# Patient Record
Sex: Female | Born: 1995 | Race: Black or African American | Hispanic: No | Marital: Single | Smoking: Never smoker
Health system: Southern US, Community
[De-identification: ages and names within clinical notes are randomized; demographics above are authoritative.]

## PROBLEM LIST (undated history)

## (undated) DIAGNOSIS — L309 Dermatitis, unspecified: Secondary | ICD-10-CM

## (undated) HISTORY — DX: Dermatitis, unspecified: L30.9

## (undated) HISTORY — PX: TONSILLECTOMY: SUR1361

## (undated) HISTORY — PX: TYMPANOSTOMY TUBE PLACEMENT: SHX32

---

## 2018-01-03 ENCOUNTER — Other Ambulatory Visit: Payer: Self-pay | Admitting: Family Medicine

## 2018-01-03 DIAGNOSIS — N6310 Unspecified lump in the right breast, unspecified quadrant: Secondary | ICD-10-CM

## 2018-01-12 ENCOUNTER — Other Ambulatory Visit: Payer: Self-pay | Admitting: Family Medicine

## 2018-01-12 ENCOUNTER — Ambulatory Visit
Admission: RE | Admit: 2018-01-12 | Discharge: 2018-01-12 | Disposition: A | Discharge status: Home / Self Care | Payer: BLUE CROSS/BLUE SHIELD | Source: Ambulatory Visit | Attending: Family Medicine | Admitting: Family Medicine

## 2018-01-12 DIAGNOSIS — N6310 Unspecified lump in the right breast, unspecified quadrant: Secondary | ICD-10-CM

## 2018-07-16 ENCOUNTER — Other Ambulatory Visit: Payer: BLUE CROSS/BLUE SHIELD

## 2018-07-16 ENCOUNTER — Other Ambulatory Visit: Payer: Self-pay | Admitting: Family Medicine

## 2018-07-16 DIAGNOSIS — N6310 Unspecified lump in the right breast, unspecified quadrant: Secondary | ICD-10-CM

## 2018-07-26 ENCOUNTER — Ambulatory Visit
Admission: RE | Admit: 2018-07-26 | Discharge: 2018-07-26 | Disposition: A | Discharge status: Home / Self Care | Payer: BLUE CROSS/BLUE SHIELD | Source: Ambulatory Visit | Attending: Family Medicine | Admitting: Family Medicine

## 2018-07-26 ENCOUNTER — Other Ambulatory Visit: Payer: Self-pay | Admitting: Family Medicine

## 2018-07-26 DIAGNOSIS — N6310 Unspecified lump in the right breast, unspecified quadrant: Secondary | ICD-10-CM

## 2018-07-31 ENCOUNTER — Other Ambulatory Visit: Payer: Self-pay | Admitting: Family Medicine

## 2019-01-29 ENCOUNTER — Other Ambulatory Visit: Payer: BLUE CROSS/BLUE SHIELD

## 2019-02-07 ENCOUNTER — Inpatient Hospital Stay: Admission: RE | Admit: 2019-02-07 | Payer: BLUE CROSS/BLUE SHIELD | Source: Ambulatory Visit

## 2019-02-18 ENCOUNTER — Ambulatory Visit
Admission: RE | Admit: 2019-02-18 | Discharge: 2019-02-18 | Disposition: A | Discharge status: Home / Self Care | Payer: BC Managed Care – PPO | Source: Ambulatory Visit | Attending: Family Medicine | Admitting: Family Medicine

## 2019-02-18 ENCOUNTER — Other Ambulatory Visit: Payer: Self-pay

## 2019-02-18 DIAGNOSIS — N6310 Unspecified lump in the right breast, unspecified quadrant: Secondary | ICD-10-CM

## 2019-09-06 IMAGING — US ULTRASOUND RIGHT BREAST LIMITED
1 series · 5 of 5 positions shown · non-contrast
Comparison: None

CLINICAL DATA: Palpable lump in the right breast

EXAM:
ULTRASOUND OF THE RIGHT BREAST

[Series 1: ultrasound right breast limited · 0.06mm/px · 5 of 5 slices shown]
[im 1/5]
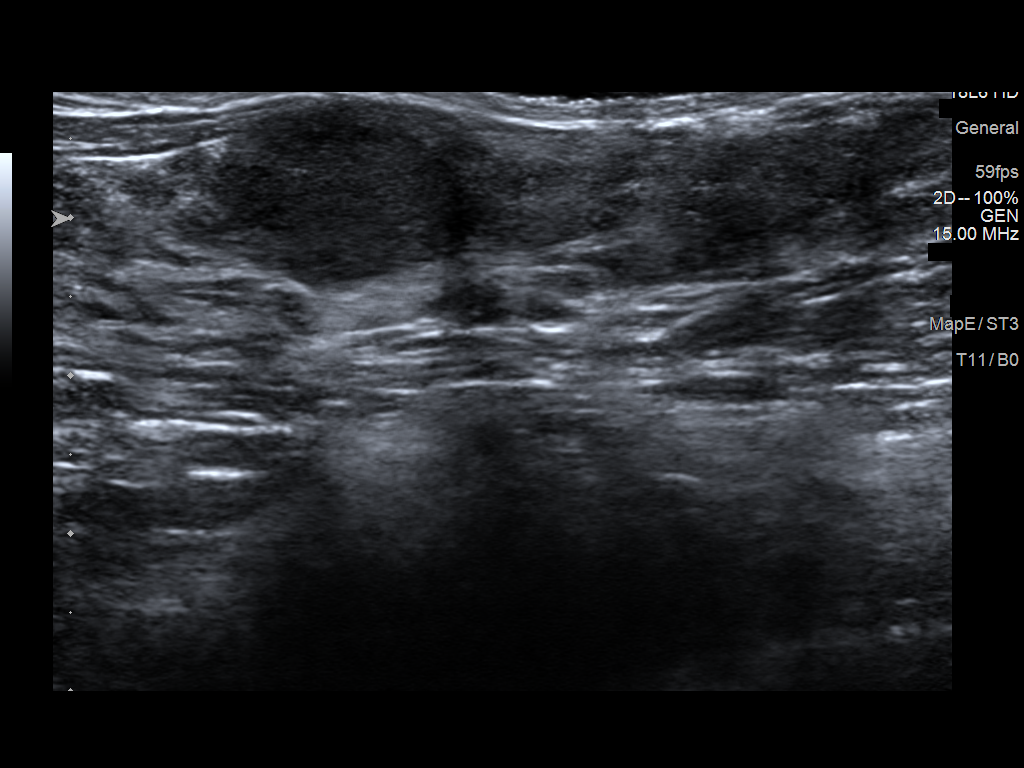
[im 2/5]
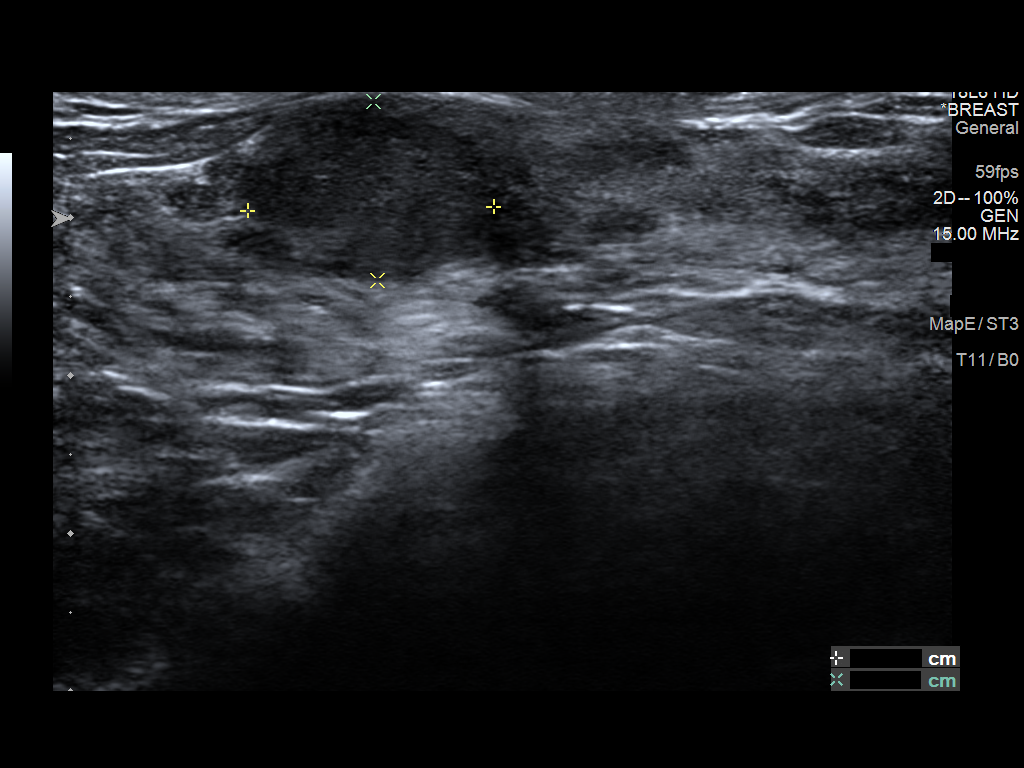
[im 3/5]
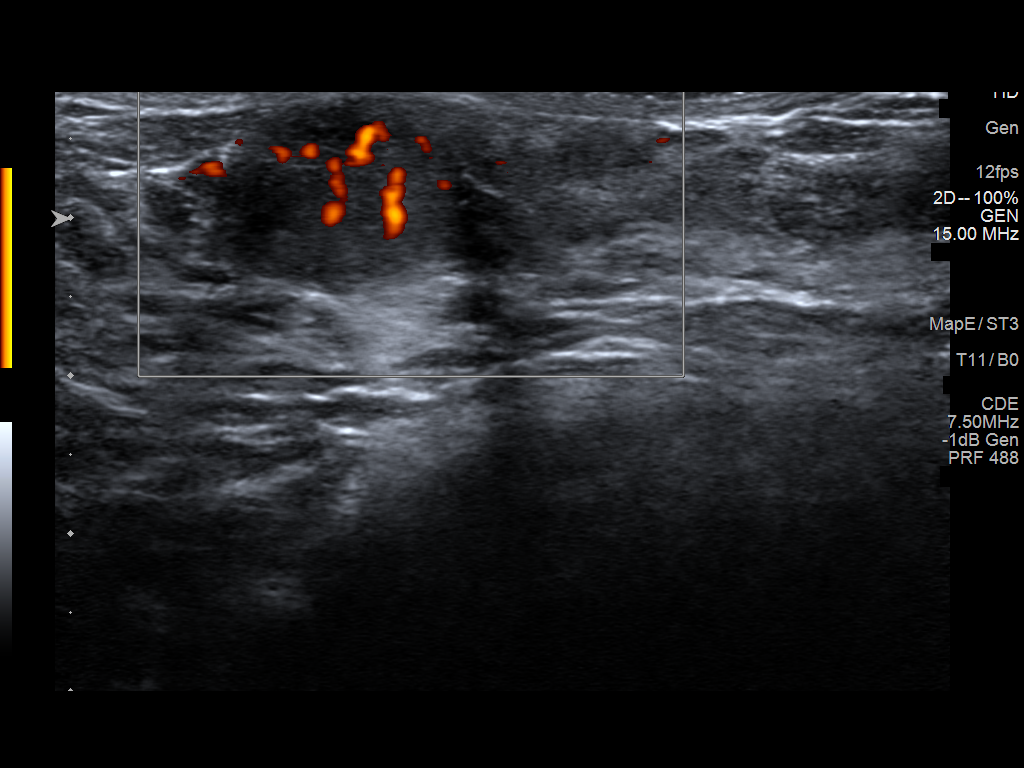
[im 4/5]
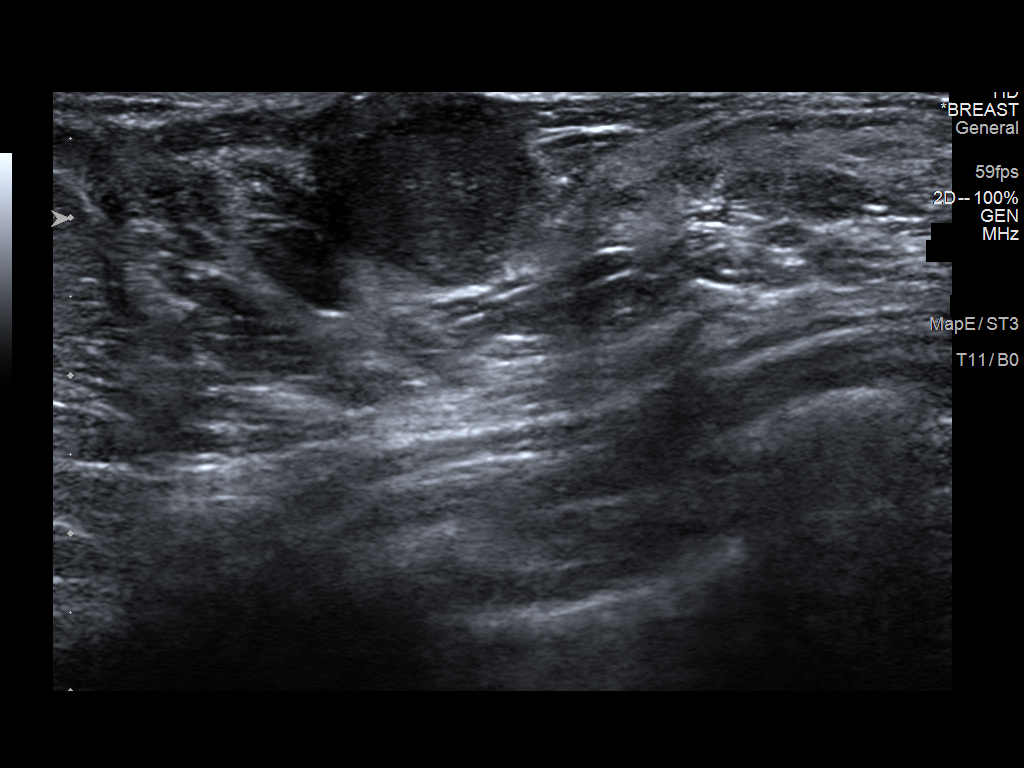
[im 5/5]
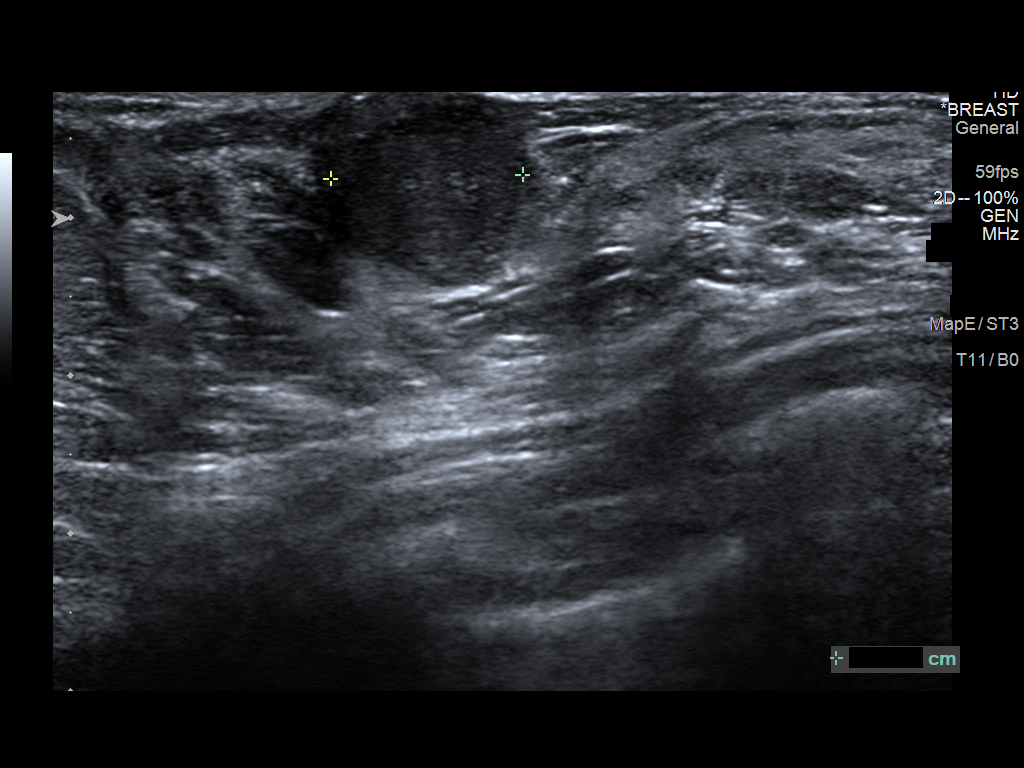

[5 of 5 positions shown; findings below may reference images not displayed]

FINDINGS: On physical exam, a palpable lump is felt in the right breast
inferiorly

Targeted ultrasound is performed, showing a hypoechoic circumscribed
oval mass in the right breast at [DATE], 2 cm from the nipple
measuring 1.6 x 1.1 x 1.2 cm.
IMPRESSION: Probably benign right breast mass

RECOMMENDATION:
Six-month follow-up ultrasound of the probably benign right breast
mass

I have discussed the findings and recommendations with the patient.
Results were also provided in writing at the conclusion of the
visit. If applicable, a reminder letter will be sent to the patient
regarding the next appointment.

BI-RADS CATEGORY  3: Probably benign.

## 2020-01-24 ENCOUNTER — Other Ambulatory Visit: Payer: Self-pay

## 2020-01-24 ENCOUNTER — Other Ambulatory Visit: Payer: Self-pay | Admitting: Sleep Medicine

## 2020-01-24 DIAGNOSIS — I471 Supraventricular tachycardia: Secondary | ICD-10-CM

## 2020-01-25 LAB — NOVEL CORONAVIRUS, NAA: SARS-CoV-2, NAA: NOT DETECTED

## 2020-03-19 IMAGING — US ULTRASOUND RIGHT BREAST LIMITED
1 series · 6 of 6 positions shown · non-contrast
Comparison: 01/12/2018 ultrasound

CLINICAL DATA: 22-year-old female for six-month follow-up of RIGHT
breast mass.

EXAM:
ULTRASOUND OF THE RIGHT BREAST

[Series 1: ultrasound right breast limited · 0.06mm/px · 6 of 6 slices shown]
[im 1/6]
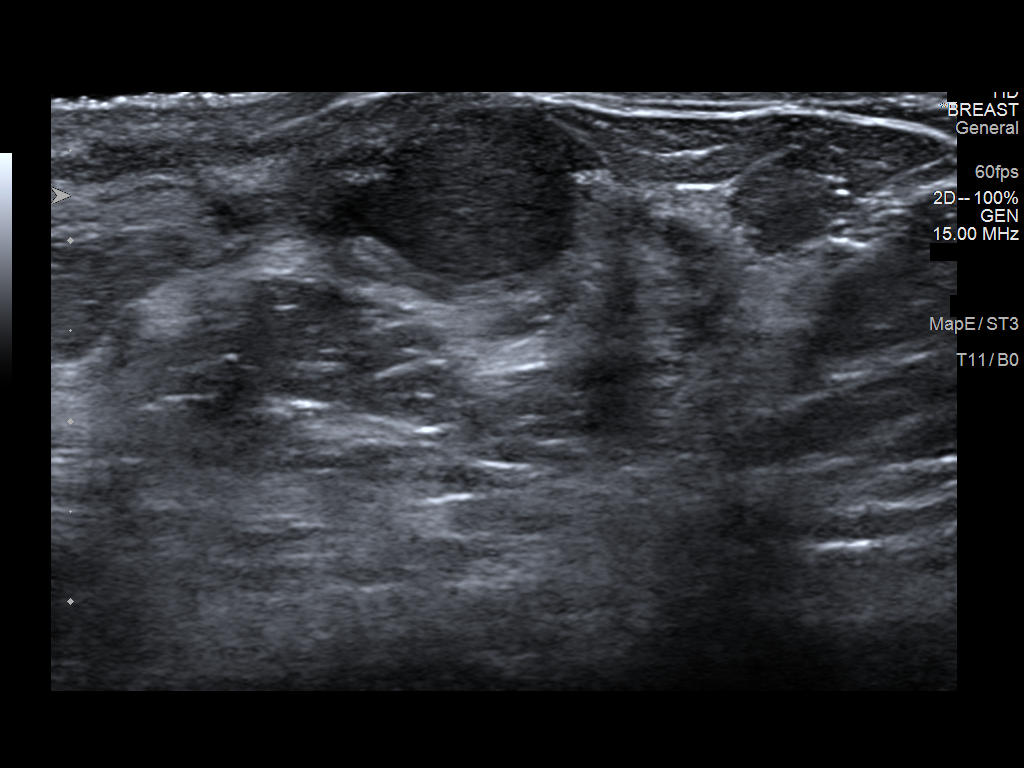
[im 2/6]
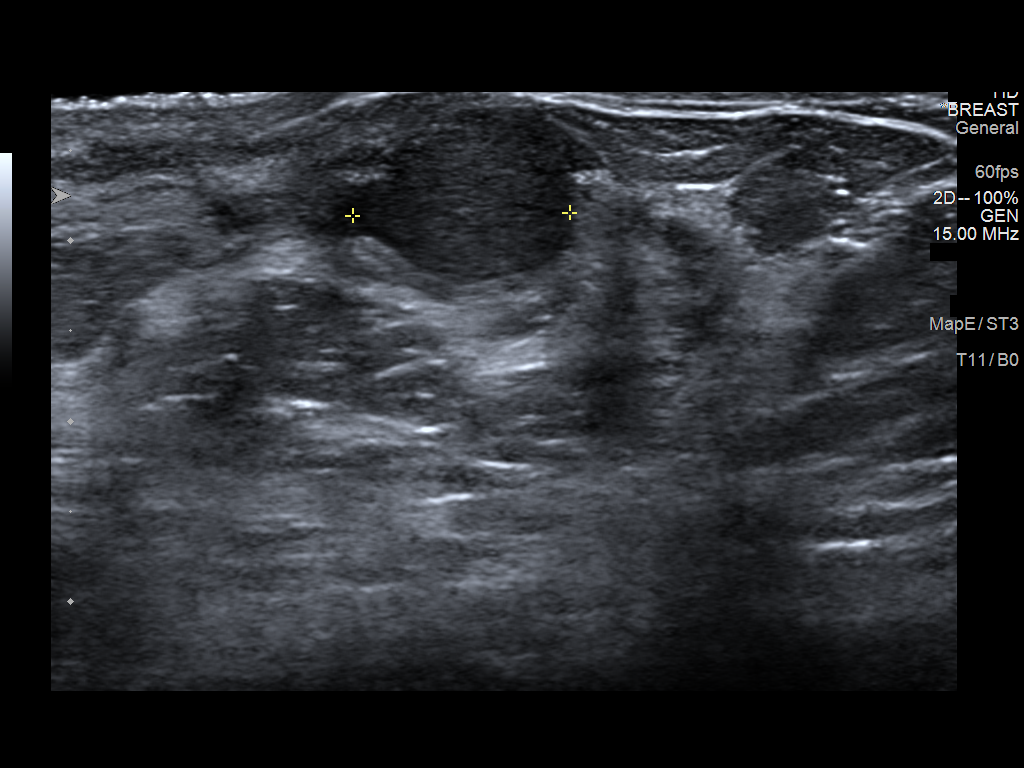
[im 3/6]
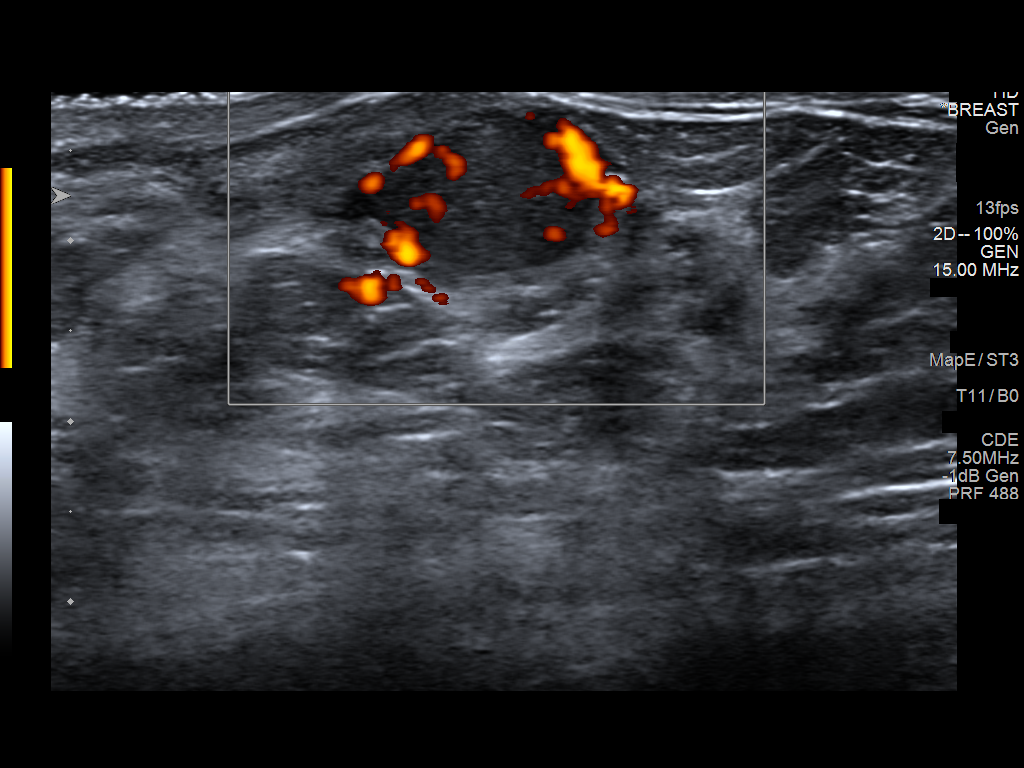
[im 4/6]
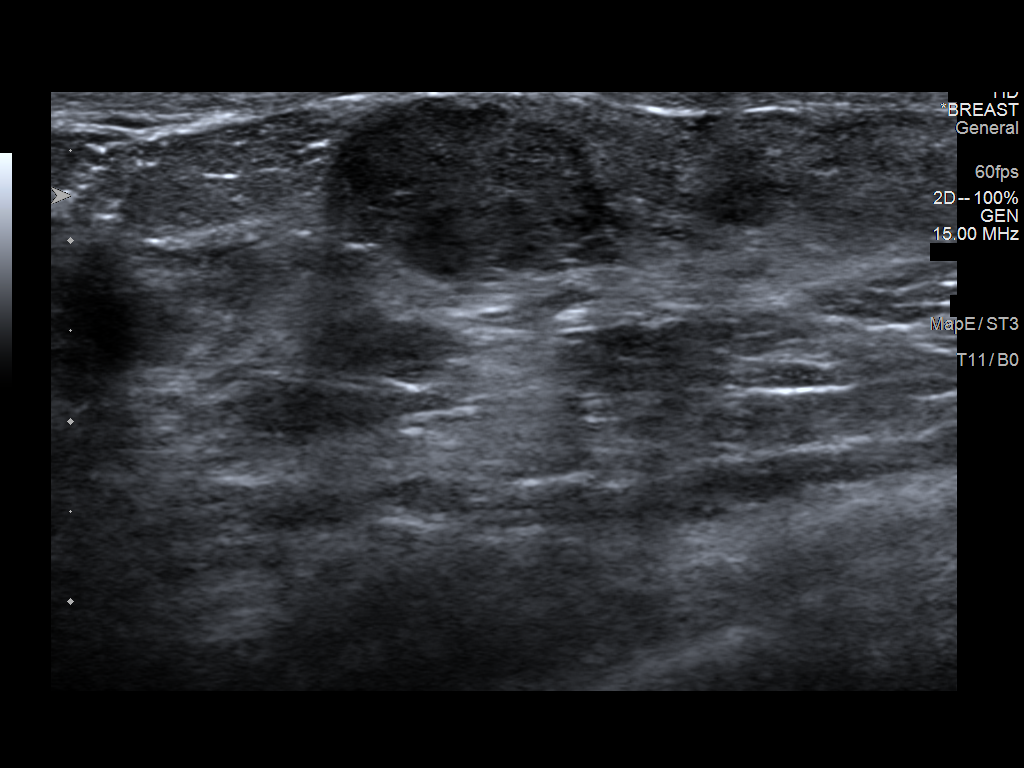
[im 5/6]
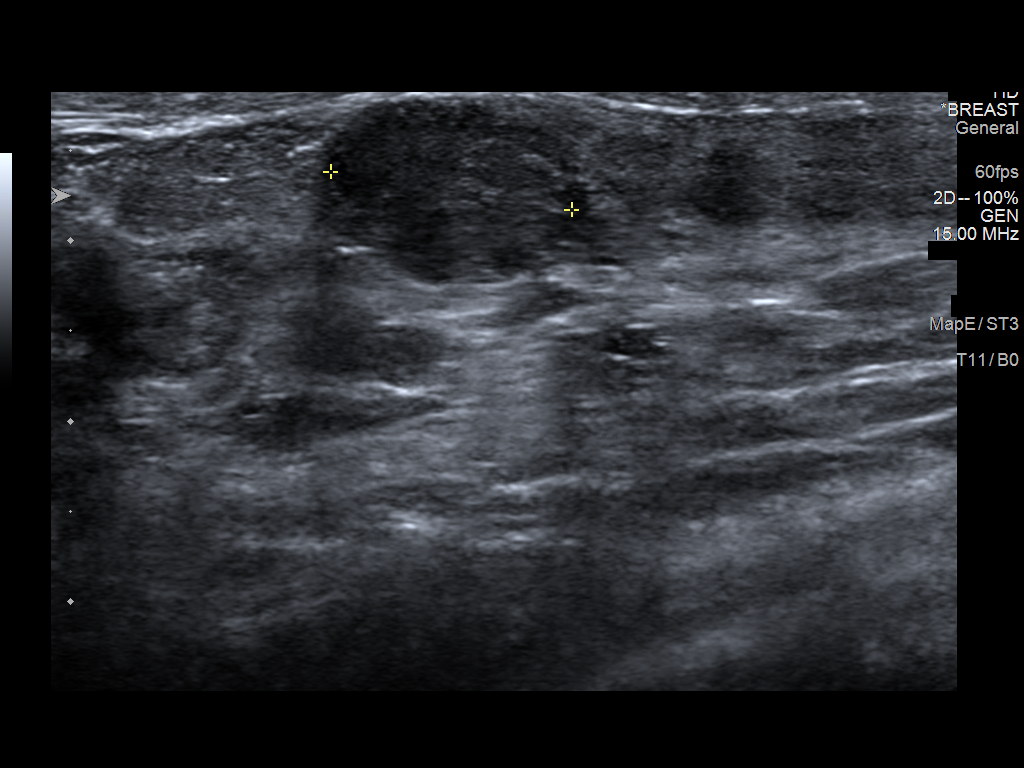
[im 6/6]
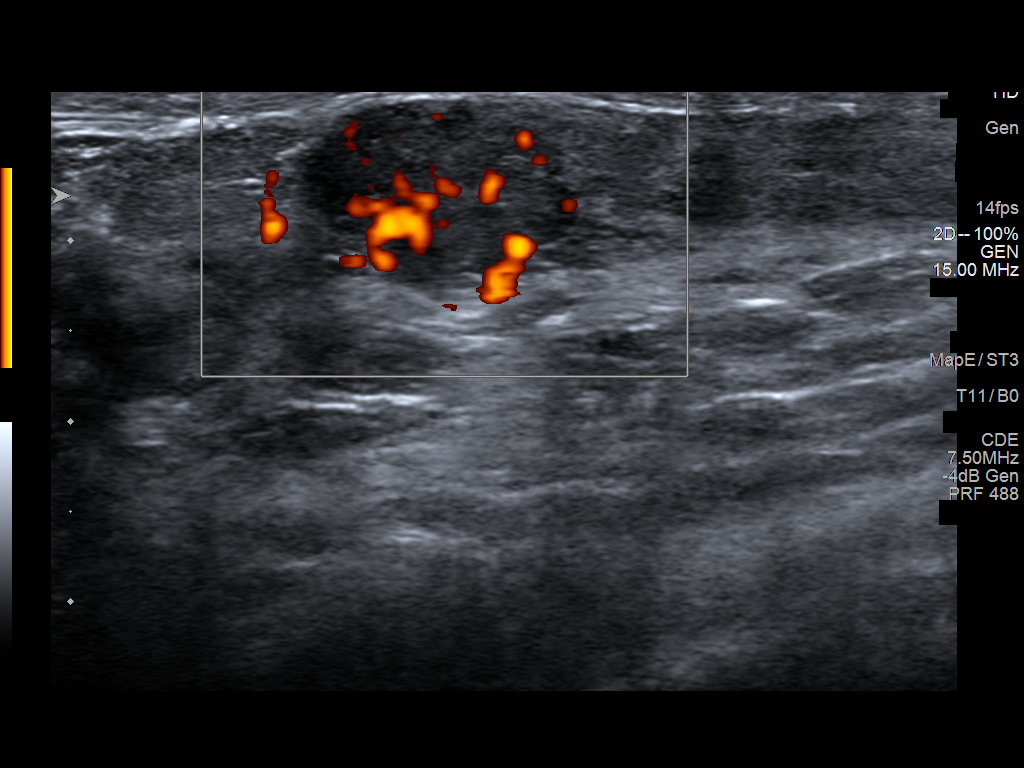

[6 of 6 positions shown; findings below may reference images not displayed]

FINDINGS: Targeted ultrasound is performed, showing a stable 1.4 x 1.1 x
cm circumscribed oval hypoechoic parallel mass at the [DATE] position
of the RIGHT breast 2 cm from the nipple, likely a fibroadenoma.
IMPRESSION: Stable probable fibroadenoma within the LOWER INNER RIGHT breast.
Six-month follow-up recommended to ensure 1 year stability.

RECOMMENDATION:
RIGHT breast ultrasound in 6 month

I have discussed the findings and recommendations with the patient.
Results were also provided in writing at the conclusion of the
visit. If applicable, a reminder letter will be sent to the patient
regarding the next appointment.

BI-RADS CATEGORY  3: Probably benign.

## 2020-10-12 IMAGING — US US BREAST*R* LIMITED INC AXILLA
1 series · 4 of 4 positions shown · non-contrast
Comparison: 07/26/2018, 01/12/2018.

CLINICAL DATA: One year interval follow-up of a likely benign mass
involving the LOWER INNER QUADRANT of the RIGHT breast, possibly a
fibroadenoma.

EXAM:
ULTRASOUND OF THE RIGHT BREAST

[Series 1: us breast*right* limited inc axilla · 0.06mm/px · 4 of 4 slices shown]
[im 1/4]
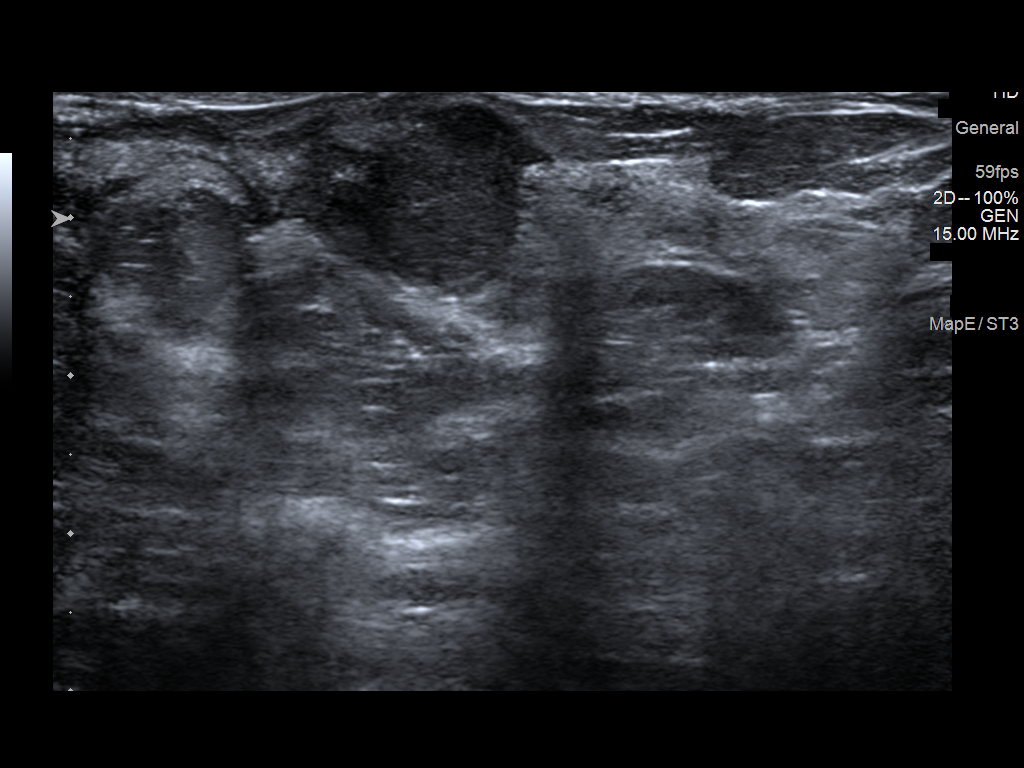
[im 2/4]
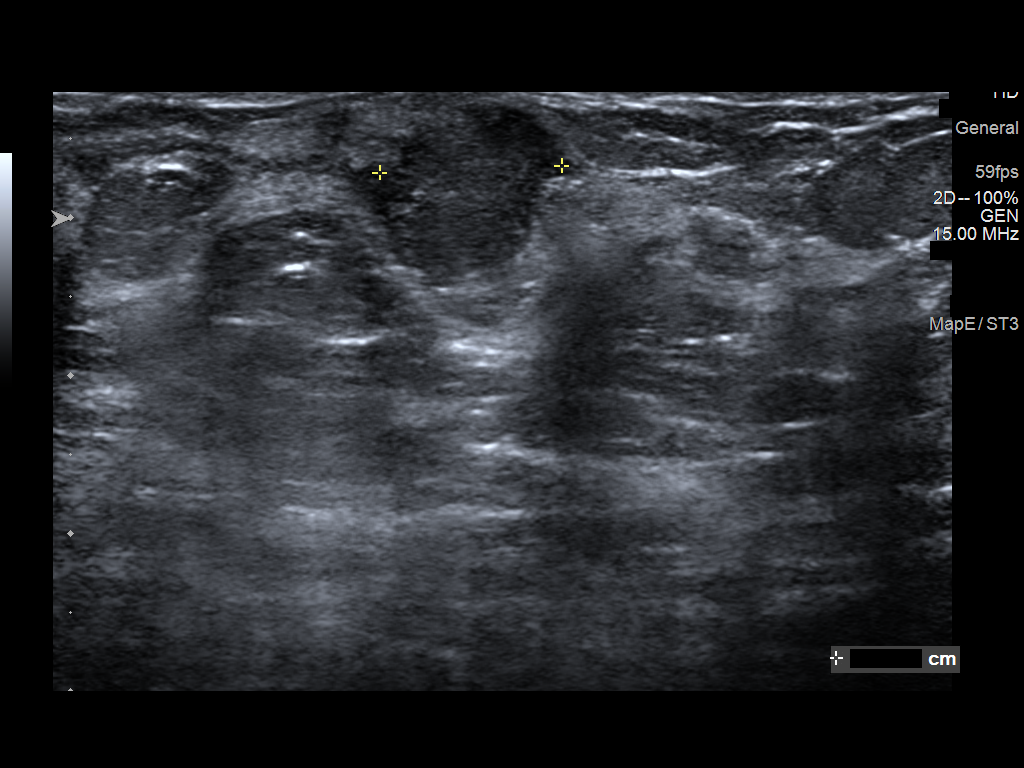
[im 3/4]
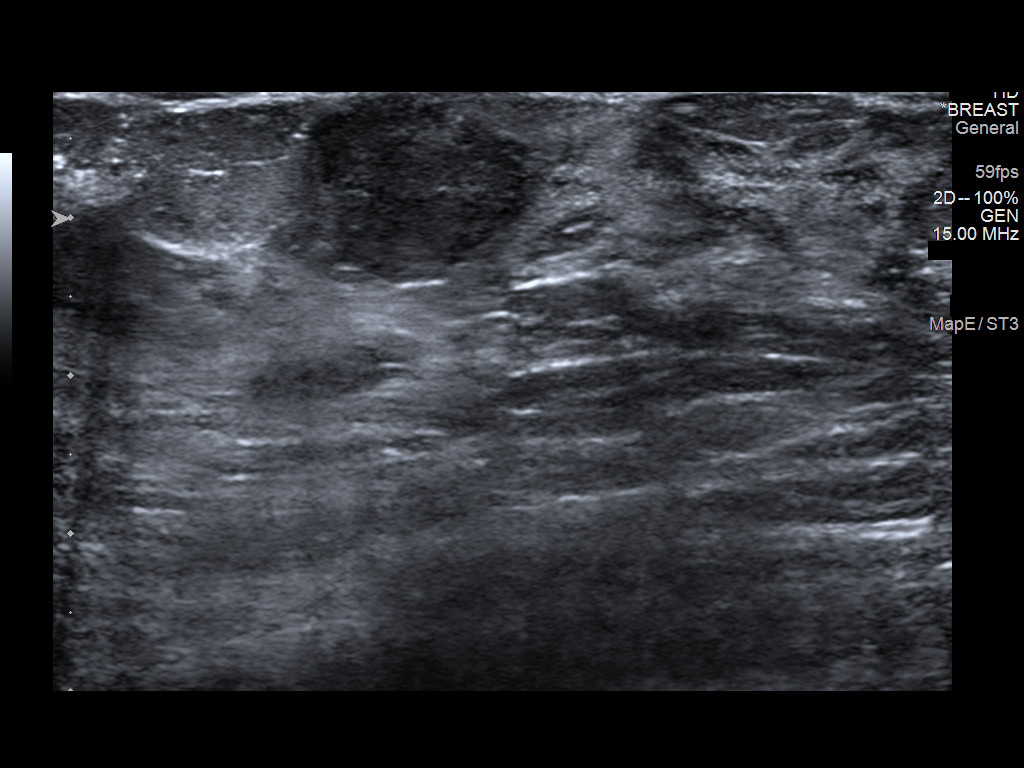
[im 4/4]
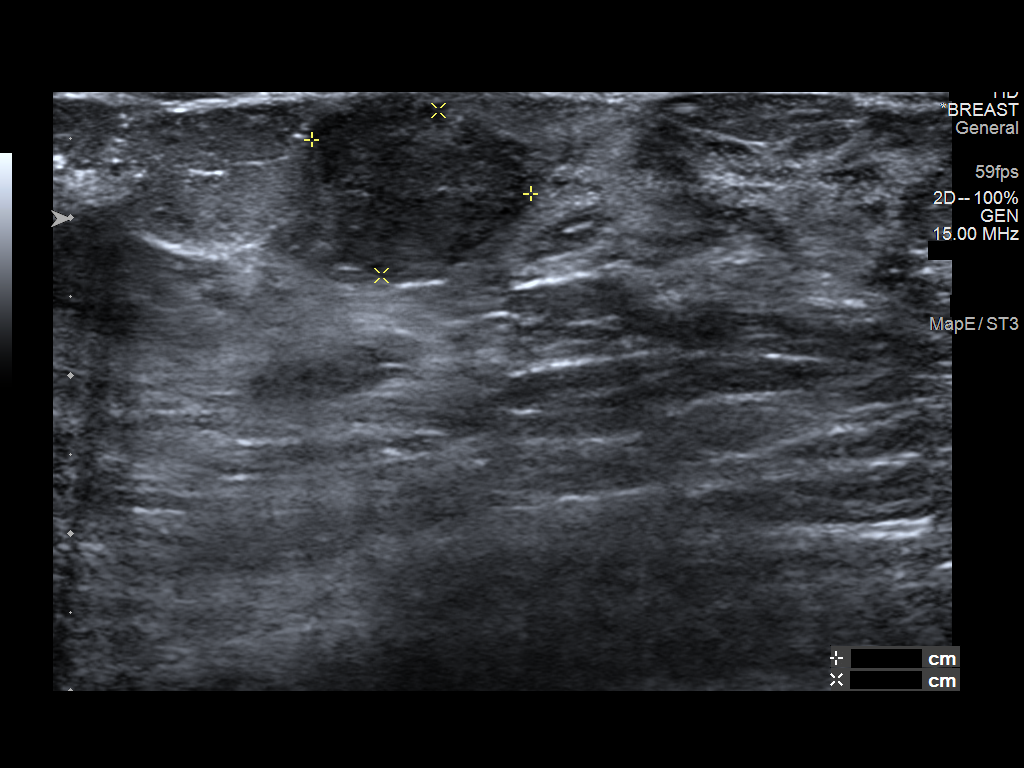

[4 of 4 positions shown; findings below may reference images not displayed]

FINDINGS: Targeted RIGHT breast ultrasound is performed, showing the
previously identified circumscribed oval parallel hypoechoic
superficial mass at the 5:30 o'clock position approximately 2 cm
from the nipple currently measures approximately 1.1 x 1.4 x 1.2 cm
(1.1 x 1.6 x 1.2 cm on the initial 01/12/2018 ultrasound). The mass
demonstrates no posterior characteristics.
IMPRESSION: Stable likely benign fibroadenoma involving the LOWER INNER QUADRANT
of the RIGHT breast.

RECOMMENDATION:
RIGHT breast ultrasound in 1 year which [DATE] years of
stability of the likely benign RIGHT breast mass.

I have discussed the findings and recommendations with the patient.
If applicable, a reminder letter will be sent to the patient
regarding the next appointment.

BI-RADS CATEGORY  3: Probably benign.

## 2021-10-06 NOTE — Progress Notes (Signed)
New Patient Note  RE: Cheyenne Coleman MRN: [id number] DOB: [birth year] Date of Office Visit: 10/07/2021  Consult requested by: No ref. 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 And wants a note saying that she cannot work in that dept. For 8hrs. Cheyenne Coleman been off anti-histamines for the past 3 days. )  History of Present Illness: I had the pleasure of seeing Cheyenne Coleman for initial evaluation at the [facility] on 10/07/2021. She is a 26 y.o. female, who is referred here by Pcp, No for the evaluation of allergies.  Environmental allergies: She reports symptoms of sneezing, itchy/burning eyes, eczema flare, nasal congestion, rhinorrhea. Symptoms have been going on for many years. The symptoms are present all year around with worsening in spring. Anosmia: no. Headache: sometimes. She has used zyrtec with fair improvement in symptoms. Sinus infections: no. Previous work up includes: skin testing in the past was positive to dust and pollen per patient. No prior AIT. Previous ENT evaluation: yes for tonsillectomy and tympanostomy tubes. Previous sinus imaging: no. History of nasal polyps: no. Last eye exam: not recently. History of reflux: denies.  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 The reaction occurred at the age of 4, after she had issues with her regular formula. Symptoms started within minutes and was in the form of hives, tongue swelling, diarrhea, vomiting. The symptoms lasted for a few hours. She was not evaluated in ED.  Last reaction was about 1-2 weeks ago and she had a mixed green shot that had dairy in it. She had some tongue/throat itching, hives.   She does not have access to epinephrine autoinjector and not needed to use it.    Past work up includes: patient is not sure. Dietary History: patient has been eating other foods including eggs, peanut, treenuts, sesame, shellfish, fish, soy, wheat, meats, fruits and vegetables.  She reports reading labels and avoiding dairy in diet completely including baked dairy items.   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   Assessment and Plan: Farm is a 26 y.o. female with: Other allergic rhinitis Perennial rhinoconjunctivitis symptoms with worsening in the spring.  Skin testing in the past was positive to multiple items but no prior AIT.  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 Requesting letter for work.  Today's skin testing showed: Positive to grass, trees, ragweed, weed, dust mites, mold, cat, dog. Letter written. Start environmental control measures as below. Start Singulair (montelukast) 10mg  daily at night. Cautioned that in some children/adults can experience behavioral changes including hyperactivity, agitation, depression, sleep disturbances and suicidal ideations. These side effects are rare, but if you notice them you should notify me and discontinue Singulair (montelukast). Use over the counter antihistamines such as Zyrtec (cetirizine), Claritin (loratadine), Allegra (fexofenadine), or Xyzal (levocetirizine) daily as needed. May take twice a day during allergy flares. May switch antihistamines every few months. Use olopatadine eye drops 0.7% once a day as needed for itchy/watery eyes. Sample given.  Consider allergy injections for long term control if above medications do not help the symptoms - handout given.  Declines nasal sprays.  Allergic conjunctivitis of both eyes  See assessment and plan as above.  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  Last reaction  was a few weeks ago after she had a mixed drink that contained dairy in it.  Developed tongue/throat itching and hives.  Resolved with Benadryl.  Needs a new EpiPen.  No recent testing. Declines dairy skin testing today.  Start strict avoidance of dairy. I have prescribed epinephrine injectable device and demonstrated proper use. For mild symptoms you can take over the counter antihistamines such as Benadryl and monitor symptoms closely. If symptoms worsen or if you have severe symptoms including breathing issues, throat closure, significant swelling, whole body hives, severe diarrhea and vomiting, lightheadedness then inject epinephrine and seek immediate medical care afterwards. Emergency action plan given.  Wheezing Wheezing at times when outdoors or has reaction to milk. Today's spirometry was unremarkable - machine was malfunctioning and did not calculate predicted values.  May use albuterol rescue inhaler 2 puffs every 4 to 6 hours as needed for shortness of breath, chest tightness, coughing, and wheezing. Monitor frequency of use.  Get spirometry at next visit.  Other atopic dermatitis Continue recommendations as per dermatology.  Return in about 4 months (around 02/07/2022). Recommend establishing care with a PCP.   Meds ordered this encounter  Medications   EPINEPHrine 0.3 mg/0.3 mL IJ SOAJ injection    Sig: Inject 0.3 mg into the muscle as needed for anaphylaxis.    Dispense:  1 each    Refill:  2    May dispense generic/Mylan/Teva brand.   montelukast (SINGULAIR) 10 MG tablet    Sig: Take 1 tablet (10 mg total) by mouth at bedtime.    Dispense:  30 tablet    Refill:  5   albuterol (VENTOLIN HFA) 108 (90 Base) MCG/ACT inhaler    Sig: Inhale 2 puffs into the lungs every 4 (four) hours as needed for wheezing or shortness of breath (coughing fits).    Dispense:  18 g    Refill:  1   Lab Orders  No laboratory test(s) ordered today    Other allergy screening: Asthma:   Sometimes wheezing around pollen or when having reaction to milk. No inhaler use.  Medication allergy: no Hymenoptera allergy: no Urticaria: no Eczema:yes On the hands, legs Follows with dermatology who prescribed betamethasone.  History of recurrent infections suggestive of immunodeficency: no  Diagnostics: Spirometry:  Tracings reviewed. Her effort: Good reproducible efforts. FVC: 3.18L FEV1: 2.47L FEV1/FVC ratio: 78% Interpretation: No overt abnormalities noted given today's efforts.  Please see scanned spirometry results for details.  Skin Testing: Environmental allergy panel. Positive to grass, trees, ragweed, weed, dust mites, mold, cat, dog. Results discussed with patient/family.  Airborne Adult Perc - 10/07/21 0926     Time Antigen Placed 0920    Allergen Manufacturer Lavella Hammock    Location Back    Number of Test 59    1. Control-Buffer 50% Glycerol Negative    2. Control-Histamine 1 mg/ml 3+    3. Albumin saline Negative    4. Caban Negative    5. [place] 2+    6. Johnson 3+    7. Kentucky Blue 3+    8. Meadow Fescue 2+    9. Perennial Rye 3+    10. Sweet Vernal 2+    11. Timothy 2+    12. Cocklebur Negative    13. Burweed Marshelder Negative    14. Ragweed, short Negative    15. Ragweed, Giant  Negative    16. Plantain,  English Negative    17. Lamb's Quarters Negative    18. Sheep Sorrell Negative    19. Rough Pigweed Negative    20. Marsh Elder, Rough Negative    21. Mugwort, Common Negative    22. Ash mix Negative    23. Birch mix 4+    24. Beech American 2+    25. Box, Elder Negative    26. Cedar, red 2+    27. Cottonwood, [place] Negative    28. Elm mix Negative    29. Hickory Negative    30. Maple mix Negative    31. Oak, [place] mix 2+    32. Pecan Pollen Negative    33. Pine mix Negative    34. Sycamore Eastern Negative    35. Timonium, Black Pollen Negative    36. Alternaria alternata Negative    37. Cladosporium Herbarum Negative    38.  Aspergillus mix Negative    39. Penicillium mix Negative    40. Bipolaris sorokiniana (Helminthosporium) Negative    41. Drechslera spicifera (Curvularia) Negative    42. Mucor plumbeus Negative    43. Fusarium moniliforme Negative    44. Aureobasidium pullulans (pullulara) 4+    45. Rhizopus oryzae Negative    46. Botrytis cinera Negative    47. Epicoccum nigrum Negative    48. Phoma betae Negative    49. Candida Albicans Negative    50. Trichophyton mentagrophytes Negative    51. Mite, D Farinae  5,000 AU/ml 4+    52. Mite, D Pteronyssinus  5,000 AU/ml 3+    53. Cat Hair 10,000 BAU/ml Negative    54.  Dog Epithelia Negative    55. Mixed Feathers Negative    56. Horse Epithelia Negative    57. Cockroach, German Negative    58. Mouse Negative    59. Tobacco Leaf Negative             Intradermal - 10/07/21 1013     Time Antigen Placed 0930    Allergen Manufacturer Lavella Hammock    Location Arm    Number of Test 9    Control Negative    Ragweed mix 2+    Weed mix 2+    Mold 1 2+    Mold 2 2+    Mold 3 Negative    Cat 2+    Dog 2+    Cockroach Negative             Past Medical History: Patient Active Problem List   Diagnosis Date Noted   Other allergic rhinitis 10/07/2021   Other atopic dermatitis 10/07/2021   Anaphylactic reaction due to food, subsequent encounter 10/07/2021   Allergic conjunctivitis of both eyes 10/07/2021   Wheezing 10/07/2021   Past Medical History:  Diagnosis Date   Eczema    Past Surgical History: Past Surgical History:  Procedure Laterality Date   TONSILLECTOMY     TYMPANOSTOMY TUBE PLACEMENT     Medication List:  Current Outpatient Medications  Medication Sig Dispense Refill   albuterol (VENTOLIN HFA) 108 (90 Base) MCG/ACT inhaler Inhale 2 puffs into the lungs every 4 (four) hours as needed for wheezing or shortness of breath (coughing fits). 18 g 1   augmented betamethasone dipropionate (DIPROLENE-AF) 0.05 % ointment Apply  topically daily.     EPINEPHrine 0.3 mg/0.3 mL IJ SOAJ injection Inject 0.3 mg into the muscle as needed for anaphylaxis. 1 each 2   montelukast (SINGULAIR) 10  MG tablet Take 1 tablet (10 mg total) by mouth at bedtime. 30 tablet 5   No current facility-administered medications for this visit.   Allergies: Allergies  Allergen Reactions   Lac Bovis    Social History: Social History   Socioeconomic History   Marital status: Single    Spouse name: Not on file   Number of children: Not on file   Years of education: Not on file   Highest education level: Not on file  Occupational History   Not on file  Tobacco Use   Smoking status: Never   Smokeless tobacco: Never  Vaping Use   Vaping Use: Never used  Substance and Sexual Activity   Alcohol use: Yes    Comment: occ   Drug use: Not Currently   Sexual activity: Not on file  Other Topics Concern   Not on file  Social History Narrative   Not on file   Social Determinants of Health   Financial Resource Strain: Not on file  Food Insecurity: Not on file  Transportation Needs: Not on file  Physical Activity: Not on file  Stress: Not on file  Social Connections: Not on file   Lives in an apartment. 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   Family History: Family History  Problem Relation Age of Onset   Allergic rhinitis Father    Eczema Father    Review of Systems  Constitutional:  Negative for appetite change, chills, fever and unexpected weight change.  HENT:  Positive for congestion, rhinorrhea and sneezing.   Eyes:  Positive for itching.  Respiratory:  Negative for cough, chest tightness, shortness of breath and wheezing.   Cardiovascular:  Negative for chest pain.  Gastrointestinal:  Negative for abdominal pain.   Genitourinary:  Negative for difficulty urinating.  Skin:  Positive for rash.  Allergic/Immunologic: Positive for environmental allergies and food allergies.  Neurological:  Negative for headaches.   Objective: BP 118/78   Pulse 80   Temp 97.9 F (36.6 C)   Resp 18   Ht 5\' 3"  (1.6 m)   Wt 143 lb 8 oz (65.1 kg)   SpO2 99%   BMI 25.42 kg/m  Body mass index is 25.42 kg/m. Physical Exam Vitals and nursing note reviewed.  Constitutional:      Appearance: Normal appearance. She is well-developed.  HENT:     Head: Normocephalic and atraumatic.     Right Ear: Tympanic membrane and external ear normal.     Left Ear: Tympanic membrane and external ear normal.     Nose: Nose normal.     Mouth/Throat:     Mouth: Mucous membranes are moist.     Pharynx: Oropharynx is clear.  Eyes:     Conjunctiva/sclera: Conjunctivae normal.  Cardiovascular:     Rate and Rhythm: Normal rate and regular rhythm.     Heart sounds: Normal heart sounds. No murmur heard.   No friction rub. No gallop.  Pulmonary:     Effort: Pulmonary effort is normal.     Breath sounds: Normal breath sounds. No wheezing, rhonchi or rales.  Musculoskeletal:     Cervical back: Neck supple.  Skin:    General: Skin is warm.     Findings: No rash.  Neurological:     Mental Status: She is alert and  oriented to person, place, and time.  Psychiatric:        Behavior: Behavior normal.  The plan was reviewed with the patient/family, and all questions/concerned were addressed.  It was my pleasure to see Cheyenne Coleman today and participate in her care. Please feel free to contact me with any questions or concerns.  Sincerely,  Rexene Alberts, DO Allergy & Immunology  [facility] office: [place] office: [phone]

## 2021-10-07 ENCOUNTER — Ambulatory Visit: Payer: BC Managed Care – PPO | Admitting: Allergy

## 2021-10-07 ENCOUNTER — Other Ambulatory Visit: Payer: Self-pay

## 2021-10-07 ENCOUNTER — Encounter: Payer: Self-pay | Admitting: Allergy

## 2021-10-07 VITALS — BP 118/78 | HR 80 | Temp 97.9°F | Resp 18 | Ht 63.0 in | Wt 143.5 lb

## 2021-10-07 DIAGNOSIS — T7800XD Anaphylactic reaction due to unspecified food, subsequent encounter: Secondary | ICD-10-CM

## 2021-10-07 DIAGNOSIS — R062 Wheezing: Secondary | ICD-10-CM | POA: Diagnosis not present

## 2021-10-07 DIAGNOSIS — L2089 Other atopic dermatitis: Secondary | ICD-10-CM | POA: Diagnosis not present

## 2021-10-07 DIAGNOSIS — J3089 Other allergic rhinitis: Secondary | ICD-10-CM | POA: Insufficient documentation

## 2021-10-07 DIAGNOSIS — H1013 Acute atopic conjunctivitis, bilateral: Secondary | ICD-10-CM | POA: Diagnosis not present

## 2021-10-07 HISTORY — DX: Other allergic rhinitis: J30.89

## 2021-10-07 MED ORDER — EPINEPHRINE 0.3 MG/0.3ML IJ SOAJ: 0.3000 mg | INTRAMUSCULAR | 2 refills | Status: AC | PRN

## 2021-10-07 MED ORDER — ALBUTEROL SULFATE HFA 108 (90 BASE) MCG/ACT IN AERS: 2.0000 | INHALATION_SPRAY | RESPIRATORY_TRACT | 1 refills | Status: AC | PRN

## 2021-10-07 MED ORDER — MONTELUKAST SODIUM 10 MG PO TABS: 10.0000 mg | ORAL_TABLET | Freq: Every day | ORAL | 5 refills | Status: AC

## 2021-10-07 NOTE — Assessment & Plan Note (Signed)
Perennial rhinoconjunctivitis symptoms with worsening in the spring.  Skin testing in the past was positive to multiple items but no prior AIT.  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 Requesting letter for work.   Today's skin testing showed: Positive to grass, trees, ragweed, weed, dust mites, mold, cat, dog.  Letter written.  Start environmental control measures as below.  Start Singulair (montelukast) 10mg  daily at night.  Cautioned that in some children/adults can experience behavioral changes including hyperactivity, agitation, depression, sleep disturbances and suicidal ideations. These side effects are rare, but if you notice them you should notify me and discontinue Singulair (montelukast).  Use over the counter antihistamines such as Zyrtec (cetirizine), Claritin (loratadine), Allegra (fexofenadine), or Xyzal (levocetirizine) daily as needed. May take twice a day during allergy flares. May switch antihistamines every few months.  Use olopatadine eye drops 0.7% once a day as needed for itchy/watery eyes. Sample given.   Consider allergy injections for long term control if above medications do not help the symptoms - handout given.   Declines nasal sprays.

## 2021-10-07 NOTE — Assessment & Plan Note (Signed)
?   Continue recommendations as per dermatology. ?

## 2021-10-07 NOTE — Assessment & Plan Note (Signed)
Wheezing at times when outdoors or has reaction to milk.  Today's spirometry was unremarkable - machine was malfunctioning and did not calculate predicted values.  . May use albuterol rescue inhaler 2 puffs every 4 to 6 hours as needed for shortness of breath, chest tightness, coughing, and wheezing. Monitor frequency of use.  . Get spirometry at next visit.

## 2021-10-07 NOTE — Assessment & Plan Note (Signed)
Known allergy to milk since age 26.  Last reaction was a few weeks ago after she had a mixed drink that contained dairy in it.  Developed tongue/throat itching and hives.  Resolved with Benadryl.  Needs a new EpiPen.  No recent testing.  Declines dairy skin testing today.   Start strict avoidance of dairy.  I have prescribed epinephrine injectable device and demonstrated proper use. For mild symptoms you can take over the counter antihistamines such as Benadryl and monitor symptoms closely. If symptoms worsen or if you have severe symptoms including breathing issues, throat closure, significant swelling, whole body hives, severe diarrhea and vomiting, lightheadedness then inject epinephrine and seek immediate medical care afterwards.  Emergency action plan given.

## 2021-10-07 NOTE — Patient Instructions (Addendum)
Today's skin testing showed: Positive to grass, trees, ragweed, weed, dust mites, mold, cat, dog.  Results given. Letter written.  Environmental allergies Start environmental control measures as below. Start Singulair (montelukast) 10mg  daily at night. Cautioned that in some children/adults can experience behavioral changes including hyperactivity, agitation, depression, sleep disturbances and suicidal ideations. These side effects are rare, but if you notice them you should notify me and discontinue Singulair (montelukast). Use over the counter antihistamines such as Zyrtec (cetirizine), Claritin (loratadine), Allegra (fexofenadine), or Xyzal (levocetirizine) daily as needed. May take twice a day during allergy flares. May switch antihistamines every few months. Use olopatadine eye drops 0.7% once a day as needed for itchy/watery eyes. Sample given.   Consider allergy injections for long term control if above medications do not help the symptoms - handout given.   Food allergies Start strict avoidance of dairy. I have prescribed epinephrine injectable device and demonstrated proper use. For mild symptoms you can take over the counter antihistamines such as Benadryl and monitor symptoms closely. If symptoms worsen or if you have severe symptoms including breathing issues, throat closure, significant swelling, whole body hives, severe diarrhea and vomiting, lightheadedness then inject epinephrine and seek immediate medical care afterwards. Emergency action plan given.  Breathing May use albuterol rescue inhaler 2 puffs every 4 to 6 hours as needed for shortness of breath, chest tightness, coughing, and wheezing. Monitor frequency of use.   Eczema Continue recommendations as per dermatology.  Follow up in 4 months or sooner if needed.   Recommend establishing care with a PCP.  Reducing Pollen Exposure Pollen seasons: trees (spring), grass (summer) and ragweed/weeds (fall). Keep windows  closed in your home and car to lower pollen exposure.  Install air conditioning in the bedroom and throughout the house if possible.  Avoid going out in dry windy days - especially early morning. Pollen counts are highest between 5 - 10 AM and on dry, hot and windy days.  Save outside activities for late afternoon or after a heavy rain, when pollen levels are lower.  Avoid mowing of grass if you have grass pollen allergy. Be aware that pollen can also be transported indoors on people and pets.  Dry your clothes in an automatic dryer rather than hanging them outside where they might collect pollen.  Rinse hair and eyes before bedtime. Control of House Dust Mite Allergen Dust mite allergens are a common trigger of allergy and asthma symptoms. While they can be found throughout the house, these microscopic creatures thrive in warm, humid environments such as bedding, upholstered furniture and carpeting. Because so much time is spent in the bedroom, it is essential to reduce mite levels there.  Encase pillows, mattresses, and box springs in special allergen-proof fabric covers or airtight, zippered plastic covers.  Bedding should be washed weekly in hot water (130 F) and dried in a hot dryer. Allergen-proof covers are available for comforters and pillows that can't be regularly washed.  Wash the allergy-proof covers every few months. Minimize clutter in the bedroom. Keep pets out of the bedroom.  Keep humidity less than 50% by using a dehumidifier or air conditioning. You can buy a humidity measuring device called a hygrometer to monitor this.  If possible, replace carpets with hardwood, linoleum, or washable area rugs. If that's not possible, vacuum frequently with a vacuum that has a HEPA filter. Remove all upholstered furniture and non-washable window drapes from the bedroom. Remove all non-washable stuffed toys from the bedroom.  Wash stuffed toys  weekly. Mold Control Mold and fungi can grow on  a variety of surfaces provided certain temperature and moisture conditions exist.  Outdoor molds grow on plants, decaying vegetation and soil. The major outdoor mold, Alternaria and Cladosporium, are found in very high numbers during hot and dry conditions. Generally, a late summer - fall peak is seen for common outdoor fungal spores. Rain will temporarily lower outdoor mold spore count, but counts rise rapidly when the rainy period ends. The most important indoor molds are Aspergillus and Penicillium. Dark, humid and poorly ventilated basements are ideal sites for mold growth. The next most common sites of mold growth are the bathroom and the kitchen. Outdoor (Seasonal) Mold Control Use air conditioning and keep windows closed. Avoid exposure to decaying vegetation. Avoid leaf raking. Avoid grain handling. Consider wearing a face mask if working in moldy areas.  Indoor (Perennial) Mold Control  Maintain humidity below 50%. Get rid of mold growth on hard surfaces with water, detergent and, if necessary, 5% bleach (do not mix with other cleaners). Then dry the area completely. If mold covers an area more than 10 square feet, consider hiring an indoor environmental professional. For clothing, washing with soap and water is best. If moldy items cannot be cleaned and dried, throw them away. Remove sources e.g. contaminated carpets. Repair and seal leaking roofs or pipes. Using dehumidifiers in damp basements may be helpful, but empty the water and clean units regularly to prevent mildew from forming. All rooms, especially basements, bathrooms and kitchens, require ventilation and cleaning to deter mold and mildew growth. Avoid carpeting on concrete or damp floors, and storing items in damp areas. Pet Allergen Avoidance: Contrary to popular opinion, there are no "hypoallergenic" breeds of dogs or cats. That is because people are not allergic to an animal's hair, but to an allergen found in the animal's  saliva, dander (dead skin flakes) or urine. Pet allergy symptoms typically occur within minutes. For some people, symptoms can build up and become most severe 8 to 12 hours after contact with the animal. People with severe allergies can experience reactions in public places if dander has been transported on the pet owners' clothing. Keeping an animal outdoors is only a partial solution, since homes with pets in the yard still have higher concentrations of animal allergens. Before getting a pet, ask your allergist to determine if you are allergic to animals. If your pet is already considered part of your family, try to minimize contact and keep the pet out of the bedroom and other rooms where you spend a great deal of time. As with dust mites, vacuum carpets often or replace carpet with a hardwood floor, tile or linoleum. High-efficiency particulate air (HEPA) cleaners can reduce allergen levels over time. While dander and saliva are the source of cat and dog allergens, urine is the source of allergens from rabbits, hamsters, mice and [place] pigs; so ask a non-allergic family member to clean the animal's cage. If you have a pet allergy, talk to your allergist about the potential for allergy immunotherapy (allergy shots). This strategy can often provide long-term relief.

## 2021-10-07 NOTE — Assessment & Plan Note (Signed)
.   See assessment and plan as above. 

## 2022-02-08 ENCOUNTER — Ambulatory Visit: Payer: BC Managed Care – PPO | Admitting: Allergy

## 2022-02-08 ENCOUNTER — Encounter: Payer: Self-pay | Admitting: Allergy

## 2022-02-08 VITALS — BP 118/76 | HR 92 | Resp 16

## 2022-02-08 DIAGNOSIS — T7800XD Anaphylactic reaction due to unspecified food, subsequent encounter: Secondary | ICD-10-CM | POA: Diagnosis not present

## 2022-02-08 DIAGNOSIS — L2089 Other atopic dermatitis: Secondary | ICD-10-CM | POA: Diagnosis not present

## 2022-02-08 DIAGNOSIS — J302 Other seasonal allergic rhinitis: Secondary | ICD-10-CM

## 2022-02-08 DIAGNOSIS — H1013 Acute atopic conjunctivitis, bilateral: Secondary | ICD-10-CM

## 2022-02-08 DIAGNOSIS — R062 Wheezing: Secondary | ICD-10-CM | POA: Diagnosis not present

## 2022-02-08 DIAGNOSIS — H1010 Acute atopic conjunctivitis, unspecified eye: Secondary | ICD-10-CM | POA: Insufficient documentation

## 2022-02-08 NOTE — Patient Instructions (Addendum)
Environmental allergies 2023 skin testing showed: Positive to grass, trees, ragweed, weed, dust mites, mold, cat, dog. Continue environmental control measures as below. Use Singulair (montelukast) 10mg  daily at night. Use over the counter antihistamines such as Zyrtec (cetirizine), Claritin (loratadine), Allegra (fexofenadine), or Xyzal (levocetirizine) daily as needed. May take twice a day during allergy flares. May switch antihistamines every few months.  Food allergies Continue avoidance of dairy. For mild symptoms you can take over the counter antihistamines such as Benadryl and monitor symptoms closely. If symptoms worsen or if you have severe symptoms including breathing issues, throat closure, significant swelling, whole body hives, severe diarrhea and vomiting, lightheadedness then inject epinephrine and seek immediate medical care afterwards. Emergency action plan in place.   Breathing May use albuterol rescue inhaler 2 puffs every 4 to 6 hours as needed for shortness of breath, chest tightness, coughing, and wheezing. Monitor frequency of use.   Eczema Continue recommendations as per dermatology.  Follow up in 6 months or sooner if needed.   Recommend establishing care with a PCP.  Reducing Pollen Exposure Pollen seasons: trees (spring), grass (summer) and ragweed/weeds (fall). Keep windows closed in your home and car to lower pollen exposure.  Install air conditioning in the bedroom and throughout the house if possible.  Avoid going out in dry windy days - especially early morning. Pollen counts are highest between 5 - 10 AM and on dry, hot and windy days.  Save outside activities for late afternoon or after a heavy rain, when pollen levels are lower.  Avoid mowing of grass if you have grass pollen allergy. Be aware that pollen can also be transported indoors on people and pets.  Dry your clothes in an automatic dryer rather than hanging them outside where they might collect  pollen.  Rinse hair and eyes before bedtime. Control of House Dust Mite Allergen Dust mite allergens are a common trigger of allergy and asthma symptoms. While they can be found throughout the house, these microscopic creatures thrive in warm, humid environments such as bedding, upholstered furniture and carpeting. Because so much time is spent in the bedroom, it is essential to reduce mite levels there.  Encase pillows, mattresses, and box springs in special allergen-proof fabric covers or airtight, zippered plastic covers.  Bedding should be washed weekly in hot water (130 F) and dried in a hot dryer. Allergen-proof covers are available for comforters and pillows that can't be regularly washed.  Wash the allergy-proof covers every few months. Minimize clutter in the bedroom. Keep pets out of the bedroom.  Keep humidity less than 50% by using a dehumidifier or air conditioning. You can buy a humidity measuring device called a hygrometer to monitor this.  If possible, replace carpets with hardwood, linoleum, or washable area rugs. If that's not possible, vacuum frequently with a vacuum that has a HEPA filter. Remove all upholstered furniture and non-washable window drapes from the bedroom. Remove all non-washable stuffed toys from the bedroom.  Wash stuffed toys weekly. Mold Control Mold and fungi can grow on a variety of surfaces provided certain temperature and moisture conditions exist.  Outdoor molds grow on plants, decaying vegetation and soil. The major outdoor mold, Alternaria and Cladosporium, are found in very high numbers during hot and dry conditions. Generally, a late summer - fall peak is seen for common outdoor fungal spores. Rain will temporarily lower outdoor mold spore count, but counts rise rapidly when the rainy period ends. The most important indoor molds are Aspergillus and  Penicillium. Dark, humid and poorly ventilated basements are ideal sites for mold growth. The next most  common sites of mold growth are the bathroom and the kitchen. Outdoor (Seasonal) Mold Control Use air conditioning and keep windows closed. Avoid exposure to decaying vegetation. Avoid leaf raking. Avoid grain handling. Consider wearing a face mask if working in moldy areas.  Indoor (Perennial) Mold Control  Maintain humidity below 50%. Get rid of mold growth on hard surfaces with water, detergent and, if necessary, 5% bleach (do not mix with other cleaners). Then dry the area completely. If mold covers an area more than 10 square feet, consider hiring an indoor environmental professional. For clothing, washing with soap and water is best. If moldy items cannot be cleaned and dried, throw them away. Remove sources e.g. contaminated carpets. Repair and seal leaking roofs or pipes. Using dehumidifiers in damp basements may be helpful, but empty the water and clean units regularly to prevent mildew from forming. All rooms, especially basements, bathrooms and kitchens, require ventilation and cleaning to deter mold and mildew growth. Avoid carpeting on concrete or damp floors, and storing items in damp areas. Pet Allergen Avoidance: Contrary to popular opinion, there are no "hypoallergenic" breeds of dogs or cats. That is because people are not allergic to an animal's hair, but to an allergen found in the animal's saliva, dander (dead skin flakes) or urine. Pet allergy symptoms typically occur within minutes. For some people, symptoms can build up and become most severe 8 to 12 hours after contact with the animal. People with severe allergies can experience reactions in public places if dander has been transported on the pet owners' clothing. Keeping an animal outdoors is only a partial solution, since homes with pets in the yard still have higher concentrations of animal allergens. Before getting a pet, ask your allergist to determine if you are allergic to animals. If your pet is already considered  part of your family, try to minimize contact and keep the pet out of the bedroom and other rooms where you spend a great deal of time. As with dust mites, vacuum carpets often or replace carpet with a hardwood floor, tile or linoleum. High-efficiency particulate air (HEPA) cleaners can reduce allergen levels over time. While dander and saliva are the source of cat and dog allergens, urine is the source of allergens from rabbits, hamsters, mice and [place] pigs; so ask a non-allergic family member to clean the animal's cage. If you have a pet allergy, talk to your allergist about the potential for allergy immunotherapy (allergy shots). This strategy can often provide long-term relief.

## 2022-02-08 NOTE — Assessment & Plan Note (Signed)
Past history - Perennial rhinoconjunctivitis symptoms with worsening in the spring.  No prior AIT.  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 2023 skin testing showed: Positive to grass, trees, ragweed, weed, dust mites, mold, cat, dog. Declined nasal sprays. Interim history - not on any daily meds. Using singulair prn with good benefit.   Continue environmental control measures as below.  Use Singulair (montelukast) 10mg  daily at night.  Use over the counter antihistamines such as Zyrtec (cetirizine), Claritin (loratadine), Allegra (fexofenadine), or Xyzal (levocetirizine) daily as needed. May take twice a day during allergy flares. May switch antihistamines every few months.

## 2022-02-08 NOTE — Assessment & Plan Note (Signed)
.   Continue recommendations as per dermatology.

## 2022-02-08 NOTE — Progress Notes (Signed)
Follow Up Note  RE: Cheyenne Coleman MRN: [id number] DOB: 1995/07/25 Date of Office Visit: 02/08/2022  Referring provider: No ref. provider found Primary care provider: Pcp, No  Chief Complaint: Allergic Rhinitis   History of Present Illness: I had the pleasure of seeing Cheyenne Coleman for a follow up visit at the [facility] on 02/08/2022. She is a 26 y.o. female, who is being followed for allergic rhinoconjunctivitis, food allergy, wheezing and atopic dermatitis. Her previous allergy office visit was on 10/07/2021 with Dr. Maudie Mercury. Today is a regular follow up visit.  Environmental allergies Currently not taking any daily meds. Used Singulair with good benefit. Noticed some increased symptoms lately.  Food allergy Avoiding dairy. She can't recall if she had a major reaction. No Epipen use.    Wheezing Only used albuterol once since the last visit. Denies any SOB, coughing, wheezing, chest tightness, nocturnal awakenings, ER/urgent care visits or prednisone use since the last visit.   Atopic dermatitis Flaring on the hands and using topical steroid cream given by dermatology prn with good benefit.   Patient graduating in May.   Assessment and Plan: Cheyenne Coleman is a 26 y.o. female with: Seasonal and perennial allergic rhinoconjunctivitis Past history - Perennial rhinoconjunctivitis symptoms with worsening in the spring.  No prior AIT.  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 2023 skin testing showed: Positive to grass, trees, ragweed, weed, dust mites, mold, cat, dog. Declined nasal sprays. Interim history - not on any daily meds. Using singulair prn with good benefit.  Continue environmental control measures as below. Use Singulair (montelukast) 10mg  daily at night. Use over the counter antihistamines such as Zyrtec (cetirizine), Claritin (loratadine), Allegra (fexofenadine), or  Xyzal (levocetirizine) daily as needed. May take twice a day during allergy flares. May switch antihistamines every few months.  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  Last reaction was a few weeks ago after she had a mixed drink that contained dairy in it.  Developed tongue/throat itching and hives.  Resolved with Benadryl.  No recent testing. Declined dairy skin testing. Interim history - no major reactions. Continue avoidance of dairy. For mild symptoms you can take over the counter antihistamines such as Benadryl and monitor symptoms closely. If symptoms worsen or if you have severe symptoms including breathing issues, throat closure, significant swelling, whole body hives, severe diarrhea and vomiting, lightheadedness then inject epinephrine and seek immediate medical care afterwards. Emergency action plan in place.   Wheezing Past history - Wheezing at times when outdoors or has reaction to milk. Interim history - used albuterol once. May use albuterol rescue inhaler 2 puffs every 4 to 6 hours as needed for shortness of breath, chest tightness, coughing, and wheezing. Monitor frequency of use.  Get spirometry at next visit.  Other atopic dermatitis Continue recommendations as per dermatology.  Return in about 6 months (around 08/09/2022). Recommend establishing care with a PCP.  No orders of the defined types were placed in this encounter.  Lab Orders  No laboratory test(s) ordered today    Diagnostics: None.   Medication List:  Current Outpatient Medications  Medication Sig Dispense Refill   albuterol (VENTOLIN HFA) 108 (90 Base) MCG/ACT inhaler Inhale 2 puffs into the lungs every 4 (four) hours as needed for wheezing or shortness of breath (coughing fits). 18 g 1   augmented betamethasone  dipropionate (DIPROLENE-AF) 0.05 % ointment Apply topically daily.     EPINEPHrine 0.3 mg/0.3 mL IJ SOAJ injection Inject  0.3 mg into the muscle as needed for anaphylaxis. 1 each 2   montelukast (SINGULAIR) 10 MG tablet Take 1 tablet (10 mg total) by mouth at bedtime. (Patient not taking: Reported on 02/08/2022) 30 tablet 5   No current facility-administered medications for this visit.   Allergies: Allergies  Allergen Reactions   Milk (Cow)    I reviewed her past medical history, social history, family history, and environmental history and no significant changes have been reported from her previous visit.  Review of Systems  Constitutional:  Negative for appetite change, chills, fever and unexpected weight change.  HENT:  Negative for congestion, rhinorrhea and sneezing.   Eyes:  Negative for itching.  Respiratory:  Negative for cough, chest tightness, shortness of breath and wheezing.   Cardiovascular:  Negative for chest pain.  Gastrointestinal:  Negative for abdominal pain.  Genitourinary:  Negative for difficulty urinating.  Skin:  Positive for rash.  Allergic/Immunologic: Positive for environmental allergies and food allergies.  Neurological:  Negative for headaches.    Objective: BP 118/76   Pulse 92   Resp 16   SpO2 100%  There is no height or weight on file to calculate BMI. Physical Exam Vitals and nursing note reviewed.  Constitutional:      Appearance: Normal appearance. She is well-developed.  HENT:     Head: Normocephalic and atraumatic.     Right Ear: Tympanic membrane and external ear normal.     Left Ear: Tympanic membrane and external ear normal.     Nose: Nose normal.     Mouth/Throat:     Mouth: Mucous membranes are moist.     Pharynx: Oropharynx is clear.  Eyes:     Conjunctiva/sclera: Conjunctivae normal.  Cardiovascular:     Rate and Rhythm: Normal rate and regular rhythm.     Heart sounds: Normal heart sounds. No murmur heard.    No friction rub. No gallop.  Pulmonary:     Effort: Pulmonary effort is normal.     Breath sounds: Normal breath sounds. No wheezing,  rhonchi or rales.  Musculoskeletal:     Cervical back: Neck supple.  Skin:    General: Skin is warm.     Findings: Rash present.     Comments: Dry patch on right wrist area.   Neurological:     Mental Status: She is alert and oriented to person, place, and time.  Psychiatric:        Behavior: Behavior normal.   Previous notes and tests were reviewed. The plan was reviewed with the patient/family, and all questions/concerned were addressed.  It was my pleasure to see Cheyenne Coleman today and participate in her care. Please feel free to contact me with any questions or concerns.  Sincerely,  Wyline Mood, DO Allergy & Immunology  [facility] office: [phone] [facility] office: [phone]

## 2022-02-08 NOTE — Assessment & Plan Note (Signed)
Past history - Wheezing at times when outdoors or has reaction to milk. Interim history - used albuterol once. . May use albuterol rescue inhaler 2 puffs every 4 to 6 hours as needed for shortness of breath, chest tightness, coughing, and wheezing. Monitor frequency of use.  . Get spirometry at next visit.

## 2022-02-08 NOTE — Assessment & Plan Note (Signed)
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  Last reaction was a few weeks ago after she had a mixed drink that contained dairy in it.  Developed tongue/throat itching and hives.  Resolved with Benadryl.  No recent testing. Declined dairy skin testing. Interim history - no major reactions.  Continue avoidance of dairy.  For mild symptoms you can take over the counter antihistamines such as Benadryl and monitor symptoms closely. If symptoms worsen or if you have severe symptoms including breathing issues, throat closure, significant swelling, whole body hives, severe diarrhea and vomiting, lightheadedness then inject epinephrine and seek immediate medical care afterwards.  Emergency action plan in place.

## 2022-08-08 NOTE — Progress Notes (Deleted)
Follow Up Note  RE: Cheyenne Coleman MRN: [id number] DOB: [birth year] Date of Office Visit: 08/09/2022  Referring provider: No ref. provider found Primary care provider: Pcp, No  Chief Complaint: No chief complaint on file.  History of Present Illness: I had the pleasure of seeing Cheyenne Coleman for a follow up visit at the [facility] on 08/08/2022. She is a 27 y.o. female, who is being followed for allergic rhinoconjunctivitis, food allergy, wheezing and atopic dermatitis. Her previous allergy office visit was on 02/08/2022 with Dr. Maudie Mercury. Today is a regular follow up visit.  Seasonal and perennial allergic rhinoconjunctivitis Past history - Perennial rhinoconjunctivitis symptoms with worsening in the spring.  No prior AIT.  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 2023 skin testing showed: Positive to grass, trees, ragweed, weed, dust mites, mold, cat, dog. Declined nasal sprays. Interim history - not on any daily meds. Using singulair prn with good benefit.  Continue environmental control measures as below. Use Singulair (montelukast) 10mg  daily at night. Use over the counter antihistamines such as Zyrtec (cetirizine), Claritin (loratadine), Allegra (fexofenadine), or Xyzal (levocetirizine) daily as needed. May take twice a day during allergy flares. May switch antihistamines every few months.   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  Last reaction was a few weeks ago after she had a mixed drink that contained dairy in it.  Developed tongue/throat itching and hives.  Resolved with Benadryl.  No recent testing. Declined dairy skin testing. Interim history - no major reactions. Continue avoidance of dairy. For mild symptoms you can take over the counter antihistamines such as Benadryl and monitor symptoms closely. If  symptoms worsen or if you have severe symptoms including breathing issues, throat closure, significant swelling, whole body hives, severe diarrhea and vomiting, lightheadedness then inject epinephrine and seek immediate medical care afterwards. Emergency action plan in place.    Wheezing Past history - Wheezing at times when outdoors or has reaction to milk. Interim history - used albuterol once. May use albuterol rescue inhaler 2 puffs every 4 to 6 hours as needed for shortness of breath, chest tightness, coughing, and wheezing. Monitor frequency of use.  Get spirometry at next visit.   Other atopic dermatitis Continue recommendations as per dermatology.   Return in about 6 months (around 08/09/2022). Recommend establishing care with a PCP.  Assessment and Plan: Cheyenne Coleman is a 27 y.o. female with: No problem-specific Assessment & Plan notes found for this encounter.  No follow-ups on file.  No orders of the defined types were placed in this encounter.  Lab Orders  No laboratory test(s) ordered today    Diagnostics: Spirometry:  Tracings reviewed. Her effort: {Blank single:19197::"Good reproducible efforts.","It was hard to get consistent efforts and there is a question as to whether this reflects a maximal maneuver.","Poor effort, data can not be interpreted."} FVC: ***L FEV1: ***L, ***% predicted FEV1/FVC ratio: ***% Interpretation: {Blank single:19197::"Spirometry consistent with mild obstructive disease","Spirometry consistent with moderate obstructive disease","Spirometry consistent with severe obstructive disease","Spirometry consistent with possible restrictive disease","Spirometry consistent with mixed obstructive and restrictive disease","Spirometry uninterpretable due to technique","Spirometry consistent with normal pattern","No overt abnormalities noted given today's efforts"}.  Please see scanned spirometry results for details.  Skin Testing: {Blank single:19197::"Select  foods","Environmental allergy panel","Environmental allergy panel and select foods","Food allergy panel","None","Deferred due to recent antihistamines use"}. *** Results discussed with patient/family.  Medication List:  Current Outpatient Medications  Medication Sig Dispense Refill   albuterol (VENTOLIN HFA) 108 (90 Base) MCG/ACT inhaler Inhale 2 puffs into the lungs every 4 (four) hours as needed for wheezing or shortness of breath (coughing fits). 18 g 1   augmented betamethasone dipropionate (DIPROLENE-AF) 0.05 % ointment Apply topically daily.     EPINEPHrine 0.3 mg/0.3 mL IJ SOAJ injection Inject 0.3 mg into the muscle as needed for anaphylaxis. 1 each 2   montelukast (SINGULAIR) 10 MG tablet Take 1 tablet (10 mg total) by mouth at bedtime. (Patient not taking: Reported on 02/08/2022) 30 tablet 5   No current facility-administered medications for this visit.   Allergies: Allergies  Allergen Reactions   Milk (Cow)    I reviewed her past medical history, social history, family history, and environmental history and no significant changes have been reported from her previous visit.  Review of Systems  Constitutional:  Negative for appetite change, chills, fever and unexpected weight change.  HENT:  Negative for congestion, rhinorrhea and sneezing.   Eyes:  Negative for itching.  Respiratory:  Negative for cough, chest tightness, shortness of breath and wheezing.   Cardiovascular:  Negative for chest pain.  Gastrointestinal:  Negative for abdominal pain.  Genitourinary:  Negative for difficulty urinating.  Skin:  Positive for rash.  Allergic/Immunologic: Positive for environmental allergies and food allergies.  Neurological:  Negative for headaches.    Objective: There were no vitals taken for this visit. There is no height or weight on file to calculate BMI. Physical Exam Vitals and nursing note reviewed.  Constitutional:      Appearance: Normal appearance. She is  well-developed.  HENT:     Head: Normocephalic and atraumatic.     Right Ear: Tympanic membrane and external ear normal.     Left Ear: Tympanic membrane and external ear normal.     Nose: Nose normal.     Mouth/Throat:     Mouth: Mucous membranes are moist.     Pharynx: Oropharynx is clear.  Eyes:     Conjunctiva/sclera: Conjunctivae normal.  Cardiovascular:     Rate and Rhythm: Normal rate and regular rhythm.     Heart sounds: Normal heart sounds. No murmur heard.    No friction rub. No gallop.  Pulmonary:     Effort: Pulmonary effort is normal.     Breath sounds: Normal breath sounds. No wheezing, rhonchi or rales.  Musculoskeletal:     Cervical back: Neck supple.  Skin:    General: Skin is warm.     Findings: Rash present.     Comments: Dry patch on right wrist area.   Neurological:     Mental Status: She is alert and oriented to person, place, and time.  Psychiatric:        Behavior: Behavior normal.    Previous notes and tests were reviewed. The plan was reviewed with the patient/family, and all questions/concerned were addressed.  It was my pleasure to see Cheyenne Coleman today and participate in her care. Please feel free to contact me with any questions or concerns.  Sincerely,  Rexene Alberts, DO Allergy & Immunology  [facility] office: [place] office: [phone]

## 2022-08-09 ENCOUNTER — Ambulatory Visit: Payer: BC Managed Care – PPO | Admitting: Allergy

## 2022-08-09 DIAGNOSIS — H1010 Acute atopic conjunctivitis, unspecified eye: Secondary | ICD-10-CM

## 2022-08-09 DIAGNOSIS — R062 Wheezing: Secondary | ICD-10-CM

## 2022-08-09 DIAGNOSIS — T7800XD Anaphylactic reaction due to unspecified food, subsequent encounter: Secondary | ICD-10-CM

## 2022-08-09 DIAGNOSIS — L2089 Other atopic dermatitis: Secondary | ICD-10-CM

## 2022-08-16 NOTE — Progress Notes (Unsigned)
Follow Up Note  RE: Cheyenne Coleman MRN: [id number] DOB: Sep 04, 1995 Date of Office Visit: 08/17/2022  Referring provider: No ref. provider found Primary care provider: Pcp, No  Chief Complaint: No chief complaint on file.  History of Present Illness: I had the pleasure of seeing Cheyenne Coleman for a follow up visit at the [facility] on 08/16/2022. She is a 27 y.o. female, who is being followed for allergic rhinoconjunctivitis, food allergy, wheezing and atopic dermatitis. Her previous allergy office visit was on 02/08/2022 with Dr. Maudie Mercury. Today is a regular follow up visit.  Seasonal and perennial allergic rhinoconjunctivitis Past history - Perennial rhinoconjunctivitis symptoms with worsening in the spring.  No prior AIT.  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 2023 skin testing showed: Positive to grass, trees, ragweed, weed, dust mites, mold, cat, dog. Declined nasal sprays. Interim history - not on any daily meds. Using singulair prn with good benefit.  Continue environmental control measures as below. Use Singulair (montelukast) 10mg  daily at night. Use over the counter antihistamines such as Zyrtec (cetirizine), Claritin (loratadine), Allegra (fexofenadine), or Xyzal (levocetirizine) daily as needed. May take twice a day during allergy flares. May switch antihistamines every few months.   Anaphylactic reaction due to food, subsequent encounter Past history - Known allergy to milk since age 31.  Last reaction was a few weeks ago after she had a mixed drink that contained dairy in it.  Developed tongue/throat itching and hives.  Resolved with Benadryl.  No recent testing. Declined dairy skin testing. Interim history - no major reactions. Continue avoidance of dairy. For mild symptoms you can take over the counter antihistamines such as Benadryl and monitor symptoms closely. If  symptoms worsen or if you have severe symptoms including breathing issues, throat closure, significant swelling, whole body hives, severe diarrhea and vomiting, lightheadedness then inject epinephrine and seek immediate medical care afterwards. Emergency action plan in place.    Wheezing Past history - Wheezing at times when outdoors or has reaction to milk. Interim history - used albuterol once. May use albuterol rescue inhaler 2 puffs every 4 to 6 hours as needed for shortness of breath, chest tightness, coughing, and wheezing. Monitor frequency of use.  Get spirometry at next visit.   Other atopic dermatitis Continue recommendations as per dermatology.   Return in about 6 months (around 08/09/2022). Recommend establishing care with a PCP.  Assessment and Plan: [name] is a 27 y.o. female with: No problem-specific Assessment & Plan notes found for this encounter.  No follow-ups on file.  No orders of the defined types were placed in this encounter.  Lab Orders  No laboratory test(s) ordered today    Diagnostics: Spirometry:  Tracings reviewed. Her effort: {Blank single:19197::"Good reproducible efforts.","It was hard to get consistent efforts and there is a question as to whether this reflects a maximal maneuver.","Poor effort, data can not be interpreted."} FVC: ***L FEV1: ***L, ***% predicted FEV1/FVC ratio: ***% Interpretation: {Blank single:19197::"Spirometry consistent with mild obstructive disease","Spirometry consistent with moderate obstructive disease","Spirometry consistent with severe obstructive disease","Spirometry consistent with possible restrictive disease","Spirometry consistent with mixed obstructive and restrictive disease","Spirometry uninterpretable due to technique","Spirometry consistent with normal pattern","No overt abnormalities noted given today's efforts"}.  Please see scanned spirometry results for details.  Skin Testing: {Blank single:19197::"Select  foods","Environmental allergy panel","Environmental allergy panel and select foods","Food allergy panel","None","Deferred due to recent antihistamines use"}. *** Results discussed with patient/family.  Medication List:  Current Outpatient Medications  Medication Sig Dispense Refill   albuterol (VENTOLIN HFA) 108 (90 Base) MCG/ACT inhaler Inhale 2 puffs into the lungs every 4 (four) hours as needed for wheezing or shortness of breath (coughing fits). 18 g 1   augmented betamethasone dipropionate (DIPROLENE-AF) 0.05 % ointment Apply topically daily.     EPINEPHrine 0.3 mg/0.3 mL IJ SOAJ injection Inject 0.3 mg into the muscle as needed for anaphylaxis. 1 each 2   montelukast (SINGULAIR) 10 MG tablet Take 1 tablet (10 mg total) by mouth at bedtime. (Patient not taking: Reported on 02/08/2022) 30 tablet 5   No current facility-administered medications for this visit.   Allergies: Allergies  Allergen Reactions   Milk (Cow)    I reviewed her past medical history, social history, family history, and environmental history and no significant changes have been reported from her previous visit.  Review of Systems  Constitutional:  Negative for appetite change, chills, fever and unexpected weight change.  HENT:  Negative for congestion, rhinorrhea and sneezing.   Eyes:  Negative for itching.  Respiratory:  Negative for cough, chest tightness, shortness of breath and wheezing.   Cardiovascular:  Negative for chest pain.  Gastrointestinal:  Negative for abdominal pain.  Genitourinary:  Negative for difficulty urinating.  Skin:  Positive for rash.  Allergic/Immunologic: Positive for environmental allergies and food allergies.  Neurological:  Negative for headaches.    Objective: There were no vitals taken for this visit. There is no height or weight on file to calculate BMI. Physical Exam Vitals and nursing note reviewed.  Constitutional:      Appearance: Normal appearance. She is  well-developed.  HENT:     Head: Normocephalic and atraumatic.     Right Ear: Tympanic membrane and external ear normal.     Left Ear: Tympanic membrane and external ear normal.     Nose: Nose normal.     Mouth/Throat:     Mouth: Mucous membranes are moist.     Pharynx: Oropharynx is clear.  Eyes:     Conjunctiva/sclera: Conjunctivae normal.  Cardiovascular:     Rate and Rhythm: Normal rate and regular rhythm.     Heart sounds: Normal heart sounds. No murmur heard.    No friction rub. No gallop.  Pulmonary:     Effort: Pulmonary effort is normal.     Breath sounds: Normal breath sounds. No wheezing, rhonchi or rales.  Musculoskeletal:     Cervical back: Neck supple.  Skin:    General: Skin is warm.     Findings: Rash present.     Comments: Dry patch on right wrist area.   Neurological:     Mental Status: She is alert and oriented to person, place, and time.  Psychiatric:        Behavior: Behavior normal.    Previous notes and tests were reviewed. The plan was reviewed with the patient/family, and all questions/concerned were addressed.  It was my pleasure to see Lynn today and participate in her care. Please feel free to contact me with any questions or concerns.  Sincerely,  Rexene Alberts, DO Allergy & Immunology  [facility] office: [place] office: [phone]

## 2022-08-17 ENCOUNTER — Ambulatory Visit: Payer: BC Managed Care – PPO | Admitting: Allergy

## 2022-08-17 ENCOUNTER — Encounter: Payer: Self-pay | Admitting: Allergy

## 2022-08-17 ENCOUNTER — Other Ambulatory Visit: Payer: Self-pay

## 2022-08-17 VITALS — BP 110/86 | HR 94 | Temp 98.0°F | Resp 16 | Ht 63.0 in | Wt 158.3 lb

## 2022-08-17 DIAGNOSIS — H6992 Unspecified Eustachian tube disorder, left ear: Secondary | ICD-10-CM | POA: Insufficient documentation

## 2022-08-17 DIAGNOSIS — L2089 Other atopic dermatitis: Secondary | ICD-10-CM | POA: Diagnosis not present

## 2022-08-17 DIAGNOSIS — R062 Wheezing: Secondary | ICD-10-CM | POA: Diagnosis not present

## 2022-08-17 DIAGNOSIS — H1010 Acute atopic conjunctivitis, unspecified eye: Secondary | ICD-10-CM

## 2022-08-17 DIAGNOSIS — T7800XD Anaphylactic reaction due to unspecified food, subsequent encounter: Secondary | ICD-10-CM

## 2022-08-17 DIAGNOSIS — J302 Other seasonal allergic rhinitis: Secondary | ICD-10-CM | POA: Diagnosis not present

## 2022-08-17 DIAGNOSIS — H1013 Acute atopic conjunctivitis, bilateral: Secondary | ICD-10-CM

## 2022-08-17 NOTE — Patient Instructions (Addendum)
Ear fullness May take over the counter decongestant for a few days. Examples are sudafed or anything with Pseudoephedrine. Take in the morning and only for a few days.   Environmental allergies 2023 skin testing showed: Positive to grass, trees, ragweed, weed, dust mites, mold, cat, dog. Continue environmental control measures as below. May use Singulair (montelukast) 10mg  daily at night. May use over the counter antihistamines such as Zyrtec (cetirizine), Claritin (loratadine), Allegra (fexofenadine), or Xyzal (levocetirizine) daily as needed. May take twice a day during allergy flares. May switch antihistamines every few months.  Food allergies Continue avoidance of dairy. For mild symptoms you can take over the counter antihistamines such as Benadryl and monitor symptoms closely. If symptoms worsen or if you have severe symptoms including breathing issues, throat closure, significant swelling, whole body hives, severe diarrhea and vomiting, lightheadedness then inject epinephrine and seek immediate medical care afterwards. Emergency action plan in place.   Breathing May use albuterol rescue inhaler 2 puffs every 4 to 6 hours as needed for shortness of breath, chest tightness, coughing, and wheezing. Monitor frequency of use.   Eczema Continue recommendations as per dermatology.  Follow up in 6 months or sooner if needed. Recommend establishing care with a PCP.   Reducing Pollen Exposure Pollen seasons: trees (spring), grass (summer) and ragweed/weeds (fall). Keep windows closed in your home and car to lower pollen exposure.  Install air conditioning in the bedroom and throughout the house if possible.  Avoid going out in dry windy days - especially early morning. Pollen counts are highest between 5 - 10 AM and on dry, hot and windy days.  Save outside activities for late afternoon or after a heavy rain, when pollen levels are lower.  Avoid mowing of grass if you have grass pollen  allergy. Be aware that pollen can also be transported indoors on people and pets.  Dry your clothes in an automatic dryer rather than hanging them outside where they might collect pollen.  Rinse hair and eyes before bedtime. Control of House Dust Mite Allergen Dust mite allergens are a common trigger of allergy and asthma symptoms. While they can be found throughout the house, these microscopic creatures thrive in warm, humid environments such as bedding, upholstered furniture and carpeting. Because so much time is spent in the bedroom, it is essential to reduce mite levels there.  Encase pillows, mattresses, and box springs in special allergen-proof fabric covers or airtight, zippered plastic covers.  Bedding should be washed weekly in hot water (130 F) and dried in a hot dryer. Allergen-proof covers are available for comforters and pillows that can't be regularly washed.  Wash the allergy-proof covers every few months. Minimize clutter in the bedroom. Keep pets out of the bedroom.  Keep humidity less than 50% by using a dehumidifier or air conditioning. You can buy a humidity measuring device called a hygrometer to monitor this.  If possible, replace carpets with hardwood, linoleum, or washable area rugs. If that's not possible, vacuum frequently with a vacuum that has a HEPA filter. Remove all upholstered furniture and non-washable window drapes from the bedroom. Remove all non-washable stuffed toys from the bedroom.  Wash stuffed toys weekly. Mold Control Mold and fungi can grow on a variety of surfaces provided certain temperature and moisture conditions exist.  Outdoor molds grow on plants, decaying vegetation and soil. The major outdoor mold, Alternaria and Cladosporium, are found in very high numbers during hot and dry conditions. Generally, a late summer - fall peak  is seen for common outdoor fungal spores. Rain will temporarily lower outdoor mold spore count, but counts rise rapidly when  the rainy period ends. The most important indoor molds are Aspergillus and Penicillium. Dark, humid and poorly ventilated basements are ideal sites for mold growth. The next most common sites of mold growth are the bathroom and the kitchen. Outdoor (Seasonal) Mold Control Use air conditioning and keep windows closed. Avoid exposure to decaying vegetation. Avoid leaf raking. Avoid grain handling. Consider wearing a face mask if working in moldy areas.  Indoor (Perennial) Mold Control  Maintain humidity below 50%. Get rid of mold growth on hard surfaces with water, detergent and, if necessary, 5% bleach (do not mix with other cleaners). Then dry the area completely. If mold covers an area more than 10 square feet, consider hiring an indoor environmental professional. For clothing, washing with soap and water is best. If moldy items cannot be cleaned and dried, throw them away. Remove sources e.g. contaminated carpets. Repair and seal leaking roofs or pipes. Using dehumidifiers in damp basements may be helpful, but empty the water and clean units regularly to prevent mildew from forming. All rooms, especially basements, bathrooms and kitchens, require ventilation and cleaning to deter mold and mildew growth. Avoid carpeting on concrete or damp floors, and storing items in damp areas. Pet Allergen Avoidance: Contrary to popular opinion, there are no "hypoallergenic" breeds of dogs or cats. That is because people are not allergic to an animal's hair, but to an allergen found in the animal's saliva, dander (dead skin flakes) or urine. Pet allergy symptoms typically occur within minutes. For some people, symptoms can build up and become most severe 8 to 12 hours after contact with the animal. People with severe allergies can experience reactions in public places if dander has been transported on the pet owners' clothing. Keeping an animal outdoors is only a partial solution, since homes with pets in the  yard still have higher concentrations of animal allergens. Before getting a pet, ask your allergist to determine if you are allergic to animals. If your pet is already considered part of your family, try to minimize contact and keep the pet out of the bedroom and other rooms where you spend a great deal of time. As with dust mites, vacuum carpets often or replace carpet with a hardwood floor, tile or linoleum. High-efficiency particulate air (HEPA) cleaners can reduce allergen levels over time. While dander and saliva are the source of cat and dog allergens, urine is the source of allergens from rabbits, hamsters, mice and [place] pigs; so ask a non-allergic family member to clean the animal's cage. If you have a pet allergy, talk to your allergist about the potential for allergy immunotherapy (allergy shots). This strategy can often provide long-term relief.

## 2022-08-17 NOTE — Assessment & Plan Note (Signed)
Continue recommendations as per dermatology.

## 2022-08-17 NOTE — Assessment & Plan Note (Signed)
Past history - Known allergy to milk since age 27.  Last reaction was a few weeks ago after she had a mixed drink that contained dairy in it.  Developed tongue/throat itching and hives.  Resolved with Benadryl.  No recent testing. Declined dairy skin testing. Interim history - no reactions and not interested in re-testing. Continue avoidance of dairy. For mild symptoms you can take over the counter antihistamines such as Benadryl and monitor symptoms closely. If symptoms worsen or if you have severe symptoms including breathing issues, throat closure, significant swelling, whole body hives, severe diarrhea and vomiting, lightheadedness then inject epinephrine and seek immediate medical care afterwards. Emergency action plan in place.

## 2022-08-17 NOTE — Assessment & Plan Note (Signed)
.   See assessment and plan as above. 

## 2022-08-17 NOTE — Assessment & Plan Note (Signed)
Past history - Perennial rhinoconjunctivitis symptoms with worsening in the spring.  No prior AIT.  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 2023 skin testing showed: Positive to grass, trees, ragweed, weed, dust mites, mold, cat, dog. Declined nasal sprays. Interim history - not on any daily meds. Some ear fullness.  May take over the counter decongestant for a few days - most likely has some eustachian tube dysfunction. Declined nasal sprays.  Continue environmental control measures as below. May use Singulair (montelukast) 10mg  daily at night. May use over the counter antihistamines such as Zyrtec (cetirizine), Claritin (loratadine), Allegra (fexofenadine), or Xyzal (levocetirizine) daily as needed. May take twice a day during allergy flares. May switch antihistamines every few months.

## 2022-08-17 NOTE — Assessment & Plan Note (Signed)
Past history - Wheezing at times when outdoors or has reaction to milk. Interim history - used albuterol once every 1-2 months with good benefit. Today's spirometry was normal.  May use albuterol rescue inhaler 2 puffs every 4 to 6 hours as needed for shortness of breath, chest tightness, coughing, and wheezing. Monitor frequency of use.

## 2023-05-16 ENCOUNTER — Other Ambulatory Visit: Payer: Self-pay

## 2023-05-16 ENCOUNTER — Encounter (HOSPITAL_BASED_OUTPATIENT_CLINIC_OR_DEPARTMENT_OTHER): Payer: Self-pay | Admitting: Emergency Medicine

## 2023-05-16 ENCOUNTER — Emergency Department (HOSPITAL_BASED_OUTPATIENT_CLINIC_OR_DEPARTMENT_OTHER)
Admission: EM | Admit: 2023-05-16 | Discharge: 2023-05-16 | Disposition: A | Discharge status: Home / Self Care | Payer: BC Managed Care – PPO | Attending: Emergency Medicine | Admitting: Emergency Medicine

## 2023-05-16 DIAGNOSIS — Y92410 Unspecified street and highway as the place of occurrence of the external cause: Secondary | ICD-10-CM | POA: Insufficient documentation

## 2023-05-16 DIAGNOSIS — S139XXA Sprain of joints and ligaments of unspecified parts of neck, initial encounter: Secondary | ICD-10-CM | POA: Diagnosis not present

## 2023-05-16 DIAGNOSIS — M542 Cervicalgia: Secondary | ICD-10-CM | POA: Diagnosis present

## 2023-05-16 MED ORDER — IBUPROFEN 400 MG PO TABS: 600.0000 mg | ORAL_TABLET | Freq: Once | ORAL | Status: DC

## 2023-05-16 MED ORDER — IBUPROFEN 600 MG PO TABS: 600.0000 mg | ORAL_TABLET | Freq: Four times a day (QID) | ORAL | 0 refills | Status: AC | PRN
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
